# Patient Record
Sex: Female | Born: 1990 | Race: White | Hispanic: No | Marital: Married | Smoking: Never smoker
Health system: Southern US, Community
[De-identification: ages and names within clinical notes are randomized; demographics above are authoritative.]

## PROBLEM LIST (undated history)

## (undated) DIAGNOSIS — F419 Anxiety disorder, unspecified: Secondary | ICD-10-CM

## (undated) DIAGNOSIS — N8501 Benign endometrial hyperplasia: Secondary | ICD-10-CM

## (undated) DIAGNOSIS — N83209 Unspecified ovarian cyst, unspecified side: Secondary | ICD-10-CM

## (undated) DIAGNOSIS — N920 Excessive and frequent menstruation with regular cycle: Secondary | ICD-10-CM

## (undated) DIAGNOSIS — N8502 Endometrial intraepithelial neoplasia [EIN]: Secondary | ICD-10-CM

## (undated) DIAGNOSIS — R519 Headache, unspecified: Secondary | ICD-10-CM

## (undated) DIAGNOSIS — N979 Female infertility, unspecified: Secondary | ICD-10-CM

## (undated) DIAGNOSIS — R112 Nausea with vomiting, unspecified: Secondary | ICD-10-CM

## (undated) DIAGNOSIS — N970 Female infertility associated with anovulation: Secondary | ICD-10-CM

## (undated) DIAGNOSIS — N921 Excessive and frequent menstruation with irregular cycle: Secondary | ICD-10-CM

## (undated) DIAGNOSIS — E282 Polycystic ovarian syndrome: Secondary | ICD-10-CM

## (undated) DIAGNOSIS — Z9889 Other specified postprocedural states: Secondary | ICD-10-CM

## (undated) HISTORY — DX: Polycystic ovarian syndrome: E28.2

## (undated) HISTORY — DX: Female infertility, unspecified: N97.9

## (undated) HISTORY — DX: Benign endometrial hyperplasia: N85.01

## (undated) HISTORY — PX: APPENDECTOMY: SHX54

## (undated) HISTORY — DX: Excessive and frequent menstruation with regular cycle: N92.0

## (undated) HISTORY — DX: Unspecified ovarian cyst, unspecified side: N83.209

## (undated) HISTORY — PX: DILATION AND CURETTAGE OF UTERUS: SHX78

---
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
## 2011-05-11 ENCOUNTER — Emergency Department: Payer: Self-pay | Admitting: Emergency Medicine

## 2012-01-11 ENCOUNTER — Emergency Department: Payer: Self-pay | Admitting: Emergency Medicine

## 2012-01-11 LAB — CBC WITH DIFFERENTIAL/PLATELET
Basophil %: 0.3 %
Eosinophil #: 0.1 10*3/uL (ref 0.0–0.7)
Eosinophil %: 0.3 %
HCT: 38.1 % (ref 35.0–47.0)
HGB: 12.7 g/dL (ref 12.0–16.0)
Lymphocyte #: 1.7 10*3/uL (ref 1.0–3.6)
Lymphocyte %: 11.1 %
Monocyte #: 1.2 x10 3/mm — ABNORMAL HIGH (ref 0.2–0.9)
Monocyte %: 7.6 %
Neutrophil #: 12.4 10*3/uL — ABNORMAL HIGH (ref 1.4–6.5)
Neutrophil %: 80.7 %
WBC: 15.4 10*3/uL — ABNORMAL HIGH (ref 3.6–11.0)

## 2012-01-11 LAB — BASIC METABOLIC PANEL
Anion Gap: 8 (ref 7–16)
BUN: 12 mg/dL (ref 7–18)
Creatinine: 0.75 mg/dL (ref 0.60–1.30)
EGFR (African American): >60
EGFR (Non-African Amer.): >60
Glucose: 95 mg/dL (ref 65–99)
Osmolality: 283 (ref 275–301)

## 2012-01-11 LAB — HCG, QUANTITATIVE, PREGNANCY: Beta Hcg, Quant.: <1 m[IU]/mL — ABNORMAL LOW

## 2012-01-11 LAB — PROTIME-INR: INR: 0.9

## 2012-01-11 LAB — APTT: Activated PTT: 27.5 secs (ref 23.6–35.9)

## 2013-08-20 ENCOUNTER — Ambulatory Visit: Payer: Self-pay | Admitting: Obstetrics and Gynecology

## 2013-08-20 LAB — COMPREHENSIVE METABOLIC PANEL
ALK PHOS: 95 U/L
Albumin: 4 g/dL (ref 3.4–5.0)
Anion Gap: 4 — ABNORMAL LOW (ref 7–16)
BUN: 13 mg/dL (ref 7–18)
Bilirubin,Total: 0.5 mg/dL (ref 0.2–1.0)
CALCIUM: 9 mg/dL (ref 8.5–10.1)
Chloride: 106 mmol/L (ref 98–107)
Co2: 28 mmol/L (ref 21–32)
Creatinine: 0.66 mg/dL (ref 0.60–1.30)
EGFR (African American): >60
EGFR (Non-African Amer.): >60
Glucose: 84 mg/dL (ref 65–99)
Osmolality: 275 (ref 275–301)
POTASSIUM: 4 mmol/L (ref 3.5–5.1)
SGOT(AST): 31 U/L (ref 15–37)
SGPT (ALT): 43 U/L (ref 12–78)
SODIUM: 138 mmol/L (ref 136–145)
TOTAL PROTEIN: 7.9 g/dL (ref 6.4–8.2)

## 2013-08-20 LAB — CBC
HCT: 31.5 % — ABNORMAL LOW (ref 35.0–47.0)
HGB: 10.3 g/dL — AB (ref 12.0–16.0)
MCH: 25.5 pg — ABNORMAL LOW (ref 26.0–34.0)
MCHC: 32.8 g/dL (ref 32.0–36.0)
MCV: 78 fL — AB (ref 80–100)
Platelet: 358 10*3/uL (ref 150–440)
RBC: 4.05 10*6/uL (ref 3.80–5.20)
RDW: 15.5 % — AB (ref 11.5–14.5)
WBC: 10.5 10*3/uL (ref 3.6–11.0)

## 2013-08-28 ENCOUNTER — Ambulatory Visit: Payer: Self-pay | Admitting: Obstetrics and Gynecology

## 2013-08-30 LAB — PATHOLOGY REPORT

## 2014-09-12 NOTE — Op Note (Signed)
PATIENT NAME:  Pamela Warren, [name] MR#:  [id number] DATE OF BIRTH:  1991-01-12  DATE OF PROCEDURE:  08/28/2013  PREOPERATIVE DIAGNOSES: 1.  Abnormal uterine bleeding.  2.  Endometrial polyp.   POSTOPERATIVE DIAGNOSES:  1.  Abnormal uterine bleeding.  2.  Endometrial polyp.   PROCEDURES: 1.  Dilation and curettage.  2.  Hysteroscopy.  3.  Polypectomy.   SURGEON: Thomasene MohairStephen Azelia Reiger, M.D.   ANESTHESIA: General.   ESTIMATED BLOOD LOSS: 50 mL.  OPERATIVE FLUIDS: 800 mL crystalloid.   COMPLICATIONS: None.   FINDINGS: 1.  Normal-appearing uterine cavity anatomy.  2.  Shaggy-appearing endometrium, possible polyps.   SPECIMENS: Endometrial curettings and polyps.   CONDITION AT THE END OF PROCEDURE: Stable.   PROCEDURE IN DETAIL: The patient was taken to the operating room where general anesthesia was administered and found to be adequate. She was placed in the dorsal supine high lithotomy position in candy cane stirrups and prepped and draped in the usual sterile fashion. After a timeout was called, a sterile speculum was placed in the vagina and a single-tooth tenaculum was used to grasp the anterior lip of the cervix. The cervix was then gently dilated using Hegar dilators to a dilatation of 6 mm. The MyoSure hysteroscope was gently passed through the cervix with the above-noted findings. The MyoSure device was then connected and global curettings were taken as the endometrium had a global effect of shaggy endometrium and it was difficult to differentiate shaggy endometrium from any potential polyps, although 1 or 2 areas after being shaved by the MyoSure device did have a fair amount of bleeding, which resolved with tamponade. After the uterine cavity was cleaned, general curettage was performed, and then the camera was reintroduced after the curettage to ensure hemostasis, which was guarantee after lowering the pressure of the fluid management system below that of the patient's mean arterial  pressure.   The patient tolerated the procedure well. All instrumentation was removed from the cervix and vagina with hemostasis noted. She had a fluid deficit of approximately 200 mL. She was given doxycycline 100 mg IV on call to the OR due to her hydrosalpinx and risk for infection in her pelvis. She was wearing pneumatic compression stockings throughout the entire procedure for  VTE prophylaxis. She was awakened in the operating room and taken to the recovery area in stable condition.   ____________________________ Conard NovakStephen D. Aryam Zhan, MD sdj:sb D: 08/28/2013 10:10:01 ET T: 08/28/2013 10:39:04 ET JOB#: [id number]  cc: Conard NovakStephen D. Persephone Schriever, MD, <Dictator> Conard NovakSTEPHEN D Deondrick Searls MD ELECTRONICALLY SIGNED 09/09/2013 21:35

## 2015-07-21 ENCOUNTER — Encounter: Payer: Self-pay | Admitting: Obstetrics and Gynecology

## 2015-08-11 ENCOUNTER — Encounter: Payer: Self-pay | Admitting: Obstetrics and Gynecology

## 2015-08-25 ENCOUNTER — Ambulatory Visit (INDEPENDENT_AMBULATORY_CARE_PROVIDER_SITE_OTHER): Payer: Managed Care, Other (non HMO) | Admitting: Obstetrics and Gynecology

## 2015-08-25 ENCOUNTER — Encounter: Payer: Self-pay | Admitting: Obstetrics and Gynecology

## 2015-08-25 VITALS — BP 110/73 | HR 74 | Ht 63.0 in | Wt 238.3 lb

## 2015-08-25 DIAGNOSIS — R638 Other symptoms and signs concerning food and fluid intake: Secondary | ICD-10-CM | POA: Diagnosis not present

## 2015-08-25 DIAGNOSIS — Z6841 Body Mass Index (BMI) 40.0 and over, adult: Secondary | ICD-10-CM | POA: Diagnosis not present

## 2015-08-25 DIAGNOSIS — N979 Female infertility, unspecified: Secondary | ICD-10-CM | POA: Diagnosis not present

## 2015-08-25 DIAGNOSIS — N926 Irregular menstruation, unspecified: Secondary | ICD-10-CM

## 2015-08-25 NOTE — Patient Instructions (Signed)
1. Return on cycle day #21 for blood work 2. Return in 3 months for annual exam 3. Maintain menstrual calendar monitoring for assessment of cycles   Infertility Infertility is when you are unable to get pregnant (conceive) after a year of having sex regularly without using birth control. Infertility can also mean that a woman is not able to carry a pregnancy to full term.  Both women and men can have fertility problems. WHAT CAUSES INFERTILITY? What Causes Infertility in Women? There are many possible causes of infertility in women. For some women, the cause of infertility is not known (unexplained infertility). Infertility can also be linked to more than one cause. Infertility problems in women can be caused by problems with the menstrual cycle or reproductive organs, certain medical conditions, and factors related to lifestyle and age.  Problems with your menstrual cycle can interfere with your ovaries producing eggs (ovulation). This can make it difficult to get pregnant. This includes having a menstrual cycle that is very long, very short, or irregular.  Problems with reproductive organs can include:  An abnormally narrow cervix or a cervix that does not remain closed during a pregnancy.  A blockage in your fallopian tubes.  An abnormally shaped uterus.  Uterine fibroids. This is a tissue mass (tumor) that can develop on your uterus.  Medical conditions that can affect a woman's fertility include:  Polycystic ovarian syndrome (PCOS). This is a hormonal disorder that can cause small cysts to grow on your ovaries. This is the most common cause of infertility in women.  Endometriosis. This is a condition in which the tissue that lines your uterus (endometrium) grows outside of its normal location.  Primary ovary insufficiency. This is when your ovaries stop producing eggs and hormones before the age of 24.  Sexually transmitted diseases, such as chlamydia or gonorrhea. These  infections can cause scarring in your fallopian tubes. This makes it difficult for eggs to reach your uterus.  Autoimmune disorders. These are disorders in which your immune system attacks normal, healthy cells.  Hormone imbalances.  Other factors include:  Age. A woman's fertility declines with age, especially after her mid-30s.  Being under- or overweight.  Drinking too much alcohol.  Using drugs.  Exercising excessively.  Being exposed to environmental toxins, such as radiation, pesticides, and certain chemicals. What Causes Infertility in Men? There are many causes of infertility in men. Infertility can be linked to more than one cause. Infertility problems in men can be caused by problems with sperm or the reproductive organs, certain medical conditions, and factors related to lifestyle and age. Some men have unexplained infertility.   Problems with sperm. Infertility can result if there is a problem producing:  Enough sperm (low sperm count).  Enough normally-shaped sperm (sperm morphology).  Sperm that are able to reach the egg (poor motility).  Infertility can also be caused by:  A problem with hormones.  Enlarged veins (varicoceles), cysts (spermatoceles), or tumors of the testicles.  Sexual dysfunction.  Injury to the testicles.  A birth defect, such as not having the tubes that carry sperm (vas deferens).  Medical conditions that can affect a man's fertility include:  Diabetes.  Cancer treatments, such as chemotherapy or radiation.  Klinefelter syndrome. This is an inherited genetic disorder.   Thyroid problems, such as an under- or overactive thyroid.  Cystic fibrosis.  Sexually transmitted diseases.  Other factors include:  Age. A man's fertility declines with age.  Drinking too much alcohol.  Using  drugs.  Being exposed to environmental toxins, such as pesticides and lead. WHAT ARE THE SYMPTOMS OF INFERTILITY? Being unable to get  pregnant after one year of having regular sex without using birth control is the only sign of infertility.  HOW IS INFERTILITY DIAGNOSED? In order to be diagnosed with infertility, both partners will have a physical exam. Both partners will also have an extensive medical and sexual history taken. If there is no obvious reason for infertility, additional tests may be done. What Tests Will Women Have? Women may first have tests to check whether they are ovulating each month. The tests may include:  Blood tests to check hormone levels.  An ultrasound of the ovaries. This looks for possible problems on or in the ovaries.  Taking a small sample of the tissue that lines the uterus for examination under a microscope (endometrial biopsy). Women who are ovulating may have additional tests. These may include:  Hysterosalpingography.  This is an X-ray of the fallopian tubes and uterus taken after a specific type of dye is injected.  This test can show the shape of the uterus and whether the fallopian tubes are open.  Laparoscopy.  In this test, a lighted tube (laparoscope) is used to look for problems in the fallopian tubes and other female organs.  Transvaginal ultrasound.  This is an imaging test to check for abnormalities of the uterus and ovaries.  A health care provider can use this test to count the number of follicles on the ovaries.  Hysteroscopy.  This test involves using a lighted tube to examine the cervix and inside the uterus.  It is done to find any abnormalities inside the uterus. What Tests Will Men Have? Tests for men's infertility includes:  Semen tests to check sperm count, morphology, and motility.  Blood tests to check for hormone levels.  Taking a small sample of tissue from inside a testicle (biopsy). This is examined under a microscope.  Blood tests to check for genetic abnormalities (genetic testing). HOW ARE WOMEN TREATED FOR INFERTILITY?  Treatment depends  on the cause of infertility. Most cases of infertility in women are treated with medicine or surgery.  Women may take medicine to:  Correct ovulation problems.  Treat other health conditions, such as PCOS.  Surgery may be done to:  Repair damage to the ovaries, fallopian tubes, cervix, or uterus.  Remove growths from the uterus.  Remove scar tissue from the uterus, pelvis, or other female organs. HOW ARE MEN TREATED FOR INFERTILITY?  Treatment depends on the cause of infertility. Most cases of infertility in men are treated with medicine or surgery.   Men may take medicine to:  Correct hormone problems.  Treat other health conditions.  Treat sexual dysfunction.  Surgery may be done to:  Remove blockages in the reproductive tract.  Correct other structural problems of the reproductive tract. WHAT IS ASSISTED REPRODUCTIVE TECHNOLOGY? Assisted reproductive technology (ART) refers to all treatments and procedures that combine eggs and sperm outside the body to try to help a couple conceive. ART is often combined with fertility drugs to stimulate ovulation. Sometimes ART is done using eggs retrieved from another woman's body (donor eggs) or from previously frozen fertilized eggs (embryos).  There are different types of ART. These include:   Intrauterine insemination (IUI).  In this procedure, sperm is placed directly into a woman's uterus with a long, thin tube.  This may be most effective for infertility caused by sperm problems, including low sperm count and  low motility.  Can be used in combination with fertility drugs.  In vitro fertilization (IVF).  This is often done when a woman's fallopian tubes are blocked or when a man has low sperm counts.  Fertility drugs stimulate the ovaries to produce multiple eggs. Once mature, these eggs are removed from the body and combined with the sperm to be fertilized.  These fertilized eggs are then placed in the woman's uterus.    This information is not intended to replace advice given to you by your health care provider. Make sure you discuss any questions you have with your health care provider.   Document Released: 05/11/2003 Document Revised: 01/27/2015 Document Reviewed: 01/21/2014 Elsevier Interactive Patient Education [employer]

## 2015-08-25 NOTE — Progress Notes (Signed)
GYN ENCOUNTER NOTE  Subjective:       Pamela Warren is a 25 y.o. G0P0000 female is here for gynecologic evaluation of the following issues:  1.Irregular menstrual cycles   Gynecologic History Patient's last menstrual period was 08/02/2015 (approximate). Contraception: none Last Pap: Normal Last mammogram: NA  Menarche: Age 25 Intervals: Monthly, regular until 2-1/2 years ago; now every 4-7 weeks Duration: 5-7 days, mild to moderate bleeding Dysmenorrhea: Mild, not requiring medication; never missed school or work Dyspareunia: Negative Pap smear history: No abnormals PID STI history: None  Obstetric History OB History  Gravida Para Term Preterm AB SAB TAB Ectopic Multiple Living  0 0 0 0 0 0 0 0 0 0         Past Medical History  Diagnosis Date  . Heavy menstrual bleeding     Past Surgical History  Procedure Laterality Date  . Dilation and curettage of uterus      2015- cyst in tube -Dr Binnie Railjackson [place]    No current outpatient prescriptions on file prior to visit.   No current facility-administered medications on file prior to visit.    No Known Allergies  Social History   Social History  . Marital Status: Married    Spouse Name: N/A  . Number of Children: N/A  . Years of Education: N/A   Occupational History  . Not on file.   Social History Main Topics  . Smoking status: Never Smoker   . Smokeless tobacco: Not on file  . Alcohol Use: No  . Drug Use: No  . Sexual Activity: Yes    Birth Control/ Protection: None   Other Topics Concern  . Not on file   Social History Narrative  . No narrative on file    Family History  Problem Relation Age of Onset  . Diabetes Father   . Ovarian cancer Maternal Grandmother   . Breast cancer Maternal Grandmother   . Diabetes Maternal Grandmother   . Breast cancer Paternal Grandmother   . Diabetes Paternal Grandmother   . Colon cancer Paternal Grandfather   . Heart disease Neg Hx     The following portions  of the patient's history were reviewed and updated as appropriate: allergies, current medications, past family history, past medical history, past social history, past surgical history and problem list.  Review of Systems Review of Systems - General ROS: 28 pound weight loss over 2 years through healthy eating and exercise with personal trainer Hematological and Lymphatic ROS: negative Endocrine ROS: negative for - breast changes, galactorrhea, hot flashes or malaise/lethargy Breast ROS: negative for breast lumps negative for - galactorrhea Respiratory ROS: no cough, shortness of breath, or wheezing Cardiovascular ROS: no chest pain or dyspnea on exertion Gastrointestinal ROS: no abdominal pain, change in bowel habits, or black or bloody stools Genito-Urinary ROS: negative for - dyspareunia, dysuria, genital discharge, genital ulcers or pelvic pain Musculoskeletal ROS: negative   Objective:   BP 110/73 mmHg  Pulse 74  Ht 5\' 3"  (1.6 m)  Wt 238 lb 4.8 oz (108.092 kg)  BMI 42.22 kg/m2  LMP 08/02/2015 (Approximate) Physical exam-deferred  Assessment:   1. Irregular periods/menstrual cycles-every 4 -7 weeks  2. Increased BMI  3. Infertility      Plan:   1. Menstrual calendar monitoring 2. Continue with healthy eating, exercise, and ongoing slow steady weight loss 3. Screening blood work including lipid 1 panel, TSH, fasting blood sugar, hemoglobin A1c, vitamin D level, and day 22 serum progesterone level  4. Timed coitus 5. Discussed possible etiology to infertility including possible chronic anovulation due to a functional PCO status. 6. Lab studies will be shared with patient by telephone along with further management planning. If she appears to be anovulatory, we will then consider Clomid therapy. If she is ovulatory, we will have her follow-up in 3 months for further management planning and annual physical exam  A total of 45 minutes were spent face-to-face with the patient  during the encounter with greater than 50% dealing with counseling and coordination of care.  Herold Harms, MD  Note: This dictation was prepared with Dragon dictation along with smaller phrase technology. Any transcriptional errors that result from this process are unintentional.

## 2015-10-11 ENCOUNTER — Other Ambulatory Visit: Payer: Managed Care, Other (non HMO)

## 2015-10-11 ENCOUNTER — Other Ambulatory Visit: Payer: Self-pay | Admitting: Obstetrics and Gynecology

## 2015-10-11 DIAGNOSIS — N979 Female infertility, unspecified: Secondary | ICD-10-CM

## 2015-10-11 NOTE — Addendum Note (Signed)
Addended by: Marchelle FolksMILLER, Rachal Dvorsky G on: 10/11/2015 10:57 AM   Modules accepted: Orders

## 2015-10-12 ENCOUNTER — Telehealth: Payer: Self-pay

## 2015-10-12 DIAGNOSIS — N979 Female infertility, unspecified: Secondary | ICD-10-CM

## 2015-10-12 LAB — PROGESTERONE: PROGESTERONE: 0.1 ng/mL

## 2015-10-12 MED ORDER — CLOMIPHENE CITRATE 50 MG PO TABS: 50.0000 mg | ORAL_TABLET | Freq: Every day | ORAL | Status: DC

## 2015-10-12 NOTE — Telephone Encounter (Signed)
-----   Message from Herold HarmsMartin A Defrancesco, MD sent at 10/12/2015 10:22 AM EDT ----- Please notify - Abnormal Labs Serum progesterone level is consistent with no ovulation. If and when patient desires, Clomid therapy can be initiated to help with ovulation induction

## 2015-10-12 NOTE — Telephone Encounter (Signed)
Pt aware. Wanted clomid. Erx. Advised clomid days 5-9. IC days 13-16 and progesterone on day 22.

## 2015-10-28 ENCOUNTER — Ambulatory Visit: Payer: Managed Care, Other (non HMO) | Admitting: Obstetrics and Gynecology

## 2015-11-03 ENCOUNTER — Ambulatory Visit: Payer: Managed Care, Other (non HMO) | Admitting: Obstetrics and Gynecology

## 2015-11-11 ENCOUNTER — Ambulatory Visit: Payer: Managed Care, Other (non HMO) | Admitting: Obstetrics and Gynecology

## 2015-11-18 ENCOUNTER — Encounter: Payer: Self-pay | Admitting: Obstetrics and Gynecology

## 2015-11-18 ENCOUNTER — Ambulatory Visit (INDEPENDENT_AMBULATORY_CARE_PROVIDER_SITE_OTHER): Payer: Managed Care, Other (non HMO) | Admitting: Obstetrics and Gynecology

## 2015-11-18 VITALS — BP 104/69 | HR 99 | Ht 62.5 in | Wt 244.8 lb

## 2015-11-18 DIAGNOSIS — N926 Irregular menstruation, unspecified: Secondary | ICD-10-CM

## 2015-11-18 DIAGNOSIS — R638 Other symptoms and signs concerning food and fluid intake: Secondary | ICD-10-CM

## 2015-11-18 DIAGNOSIS — N979 Female infertility, unspecified: Secondary | ICD-10-CM

## 2015-11-18 NOTE — Progress Notes (Signed)
Chief complaint: 1. Infertility  Patient presents for follow-up on first cycle of Clomid therapy for infertility likely thought to be due to anovulation. Patient and 50 mg Clomid. Cycle-25 days Manifest date [facility]  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 Status post first cycle of Clomid therapy with 25 day cycle 3. Did not obtain serum progesterone testing  PLAN: 1. Continue with Clomid 50 mg daily days 5 through 9 for this cycle 2. Day 22 serum progesterone test 3. Return in 1 month for follow-up  A total of 15 minutes were spent face-to-face with the patient during this encounter and over half of that time dealt with counseling and coordination of care.  Herold HarmsMartin A Ingra Rother, MD  Note: This dictation was prepared with Dragon dictation along with smaller phrase technology. Any transcriptional errors that result from this process are unintentional.

## 2015-11-18 NOTE — Patient Instructions (Signed)
1. Continue with Clomid 50 mg a day days 5 through 9 2. Obtain serum progesterone level on 12/10/2015 3. Return for follow-up on 12/16/2015

## 2015-12-10 ENCOUNTER — Other Ambulatory Visit: Payer: Managed Care, Other (non HMO)

## 2015-12-11 LAB — PROGESTERONE: Progesterone: 0.1 ng/mL

## 2015-12-16 ENCOUNTER — Encounter: Payer: Self-pay | Admitting: Obstetrics and Gynecology

## 2015-12-16 ENCOUNTER — Ambulatory Visit (INDEPENDENT_AMBULATORY_CARE_PROVIDER_SITE_OTHER): Payer: Managed Care, Other (non HMO) | Admitting: Obstetrics and Gynecology

## 2015-12-16 VITALS — BP 114/70 | HR 89 | Ht 62.0 in | Wt 245.6 lb

## 2015-12-16 DIAGNOSIS — N970 Female infertility associated with anovulation: Secondary | ICD-10-CM

## 2015-12-16 DIAGNOSIS — N926 Irregular menstruation, unspecified: Secondary | ICD-10-CM | POA: Diagnosis not present

## 2015-12-16 DIAGNOSIS — Z6841 Body Mass Index (BMI) 40.0 and over, adult: Secondary | ICD-10-CM | POA: Diagnosis not present

## 2015-12-16 MED ORDER — CLOMIPHENE CITRATE 50 MG PO TABS: 50.0000 mg | ORAL_TABLET | Freq: Every day | ORAL | 1 refills | Status: DC

## 2015-12-16 MED ORDER — CLOMIPHENE CITRATE 50 MG PO TABS: ORAL_TABLET | ORAL | 1 refills | Status: DC

## 2015-12-16 NOTE — Progress Notes (Signed)
Chief complaint: 1. Anovulatory infertility 2. Morbid obesity  Current cycle-23 days Day 22 serum progesterone-0.1 (anovulatory) on 50 mg of Clomid Currently on day 6 of cycle  Plan: 1. Increase Clomid to 100 mg a day days 6 through 10 2. Obtained today 22 serum progesterone on 12/31/2015 3. Continue with timed intercourse 4. Return on 01/11/2016 for follow-up 5. May add metformin with next Cycle if necessary  A total of 15 minutes were spent face-to-face with the patient during this encounter and over half of that time dealt with counseling and coordination of care.  Herold Harms, MD  Note: This dictation was prepared with Dragon dictation along with smaller phrase technology. Any transcriptional errors that result from this process are unintentional.

## 2015-12-16 NOTE — Patient Instructions (Signed)
1. Take Clomid 100 mg a day days 6 through 10 2. Obtained today 22 serum progesterone on August 11 3. Continue with timed intercourse 4. Return in August 22 for follow-up

## 2015-12-31 ENCOUNTER — Other Ambulatory Visit: Payer: Self-pay | Admitting: Obstetrics and Gynecology

## 2015-12-31 ENCOUNTER — Other Ambulatory Visit: Payer: Managed Care, Other (non HMO)

## 2016-01-01 LAB — PROGESTERONE: PROGESTERONE: 13.5 ng/mL

## 2016-01-04 ENCOUNTER — Encounter: Payer: Self-pay | Admitting: Obstetrics and Gynecology

## 2016-01-11 ENCOUNTER — Ambulatory Visit (INDEPENDENT_AMBULATORY_CARE_PROVIDER_SITE_OTHER): Payer: Managed Care, Other (non HMO) | Admitting: Obstetrics and Gynecology

## 2016-01-11 VITALS — BP 119/72 | HR 89 | Ht 62.0 in | Wt 248.6 lb

## 2016-01-11 DIAGNOSIS — N926 Irregular menstruation, unspecified: Secondary | ICD-10-CM | POA: Diagnosis not present

## 2016-01-11 DIAGNOSIS — N970 Female infertility associated with anovulation: Secondary | ICD-10-CM

## 2016-01-11 MED ORDER — CLOMIPHENE CITRATE 50 MG PO TABS: ORAL_TABLET | ORAL | 5 refills | Status: DC

## 2016-01-11 NOTE — Progress Notes (Signed)
Chief complaint: 1. Anovulatory infertility-conference  Patient presents for a conference regarding Clomid management.  Current cycle 29 days Serum progesterone-13.5 consistent with ovulation Current dose of Clomid-100 mg  Patient understands that she had ovulatory cycle with 100 mg of Clomid. She will continue this therapy over the next 5 cycles. Metformin will not be started at this time She will continue with prenatal vitamins. She will return in 4 months for follow-up.  A total of 15 minutes were spent face-to-face with the patient during this encounter and over half of that time dealt with counseling and coordination of care.  Herold HarmsMartin A Gurtej Noyola, MD  Note: This dictation was prepared with Dragon dictation along with smaller phrase technology. Any transcriptional errors that result from this process are unintentional.

## 2016-01-11 NOTE — Patient Instructions (Signed)
1. Continue with Clomid 100 mg a day days 5 through 9 2. Continue with timed intercourse on days 13, 15, 17 3. Return in 4 months for follow-up

## 2016-02-08 ENCOUNTER — Encounter: Payer: Self-pay | Admitting: Obstetrics and Gynecology

## 2016-02-14 ENCOUNTER — Encounter: Payer: Self-pay | Admitting: Obstetrics and Gynecology

## 2016-02-21 ENCOUNTER — Telehealth: Payer: Self-pay | Admitting: Obstetrics and Gynecology

## 2016-02-21 ENCOUNTER — Other Ambulatory Visit: Payer: Managed Care, Other (non HMO)

## 2016-02-21 ENCOUNTER — Encounter: Payer: Self-pay | Admitting: Obstetrics and Gynecology

## 2016-02-21 ENCOUNTER — Other Ambulatory Visit: Payer: Self-pay

## 2016-02-21 DIAGNOSIS — N912 Amenorrhea, unspecified: Secondary | ICD-10-CM

## 2016-02-21 NOTE — Telephone Encounter (Signed)
Pt still has not started her period and she wanted to see if she could come have a blood Beta HCG test. Please call her

## 2016-02-21 NOTE — Telephone Encounter (Signed)
Pt aware per my chart message she may have beta drawn. Lab ordered and pt put on lab schedule.

## 2016-02-22 ENCOUNTER — Encounter: Payer: Self-pay | Admitting: Obstetrics and Gynecology

## 2016-02-22 ENCOUNTER — Other Ambulatory Visit: Payer: Self-pay

## 2016-02-22 LAB — BETA HCG QUANT (REF LAB)

## 2016-02-22 MED ORDER — MEDROXYPROGESTERONE ACETATE 10 MG PO TABS: 10.0000 mg | ORAL_TABLET | Freq: Every day | ORAL | 0 refills | Status: DC

## 2016-02-28 ENCOUNTER — Encounter: Payer: Self-pay | Admitting: Obstetrics and Gynecology

## 2016-03-08 ENCOUNTER — Encounter: Payer: Self-pay | Admitting: Obstetrics and Gynecology

## 2016-03-08 ENCOUNTER — Other Ambulatory Visit: Payer: Self-pay

## 2016-03-08 MED ORDER — MEDROXYPROGESTERONE ACETATE 10 MG PO TABS: 10.0000 mg | ORAL_TABLET | Freq: Every day | ORAL | 0 refills | Status: DC

## 2016-03-08 MED ORDER — CLOMIPHENE CITRATE 50 MG PO TABS: ORAL_TABLET | ORAL | 1 refills | Status: DC

## 2016-06-29 ENCOUNTER — Encounter: Payer: Self-pay | Admitting: Obstetrics and Gynecology

## 2017-09-11 ENCOUNTER — Encounter: Payer: Self-pay | Admitting: Obstetrics and Gynecology

## 2017-09-11 ENCOUNTER — Ambulatory Visit: Payer: BC Managed Care – PPO | Admitting: Obstetrics and Gynecology

## 2017-09-11 ENCOUNTER — Telehealth: Payer: Self-pay | Admitting: Obstetrics and Gynecology

## 2017-09-11 VITALS — BP 102/70 | HR 87 | Ht 62.0 in | Wt 237.6 lb

## 2017-09-11 DIAGNOSIS — Z6841 Body Mass Index (BMI) 40.0 and over, adult: Secondary | ICD-10-CM | POA: Diagnosis not present

## 2017-09-11 DIAGNOSIS — N926 Irregular menstruation, unspecified: Secondary | ICD-10-CM | POA: Diagnosis not present

## 2017-09-11 DIAGNOSIS — N921 Excessive and frequent menstruation with irregular cycle: Secondary | ICD-10-CM | POA: Diagnosis not present

## 2017-09-11 DIAGNOSIS — N83209 Unspecified ovarian cyst, unspecified side: Secondary | ICD-10-CM

## 2017-09-11 MED ORDER — CLOMIPHENE CITRATE 50 MG PO TABS: 100.0000 mg | ORAL_TABLET | Freq: Every day | ORAL | 1 refills | Status: DC

## 2017-09-11 MED ORDER — MEDROXYPROGESTERONE ACETATE 10 MG PO TABS: ORAL_TABLET | ORAL | 0 refills | Status: DC

## 2017-09-11 NOTE — Telephone Encounter (Signed)
Spoke with phramacist earlier to day. Per mad pt needs 30mg  provera qd x 30.

## 2017-09-11 NOTE — Patient Instructions (Signed)
1.  Endometrial biopsy is performed today 2.  Pelvic ultrasound is scheduled 3.  Begin Provera 30 mg a day times 30 days. 4.  Start Clomid following completion of Provera therapy, taking 100 mg a day on days 5 through 9 5.  Return for day 22 serum progesterone level blood test 6.  Follow-up in 10 weeks 7.  Be sure to continue taking prenatal vitamins daily.   Endometrial Biopsy, Care After This sheet gives you information about how to care for yourself after your procedure. Your health care provider may also give you more specific instructions. If you have problems or questions, contact your health care provider. What can I expect after the procedure? After the procedure, it is common to have:  Mild cramping.  A small amount of vaginal bleeding for a few days. This is normal.  Follow these instructions at home:  Take over-the-counter and prescription medicines only as told by your health care provider.  Do not douche, use tampons, or have sexual intercourse until your health care provider approves.  Return to your normal activities as told by your health care provider. Ask your health care provider what activities are safe for you.  Follow instructions from your health care provider about any activity restrictions, such as restrictions on strenuous exercise or heavy lifting. Contact a health care provider if:  You have heavy bleeding, or bleed for longer than 2 days after the procedure.  You have bad smelling discharge from your vagina.  You have a fever or chills.  You have a burning sensation when urinating or you have difficulty urinating.  You have severe pain in your lower abdomen. Get help right away if:  You have severe cramps in your stomach or back.  You pass large blood clots.  Your bleeding increases.  You become weak or light-headed, or you pass out. Summary  After the procedure, it is common to have mild cramping and a small amount of vaginal bleeding for  a few days.  Do not douche, use tampons, or have sexual intercourse until your health care provider approves.  Return to your normal activities as told by your health care provider. Ask your health care provider what activities are safe for you. This information is not intended to replace advice given to you by your health care provider. Make sure you discuss any questions you have with your health care provider. Document Released: 02/26/2013 Document Revised: 05/24/2016 Document Reviewed: 05/24/2016 Elsevier Interactive Patient Education  2017 [employer]

## 2017-09-11 NOTE — Telephone Encounter (Signed)
The patients pharmacy called and stated that they were concerned about the dosage of the patients medication that was just sent in. medroxyPROGESTERone (PROVERA) 10 MG tablet. Please advise.

## 2017-09-11 NOTE — Progress Notes (Signed)
GYN ENCOUNTER NOTE  Subjective:       Pamela Warren is a 27 y.o. G0P0000 female is here for gynecologic evaluation of the following issues:  1.  Abnormal uterine bleeding 2.  Secondary infertility  27 year old female, history of PCO, history of obesity 3, and history of anovulatory cycles, presents for ongoing evaluation of abnormal uterine bleeding and desire for fertility.  1 year ago patient had been placed on Clomid 100 mg daily on days 5 through 9 for induction of ovulation.  She took the medication for several months and then spontaneously discontinued it because of her husband's availability for attempt at conception. In October 2018 patient was reportedly diagnosed with a 10 cm simple ovarian cyst; no significant intervention was accomplished; patient was transiently symptomatic with nausea and vomiting, which spontaneously resolved.  Patient did not have any follow-up regarding that cystic mass.  Patient now desires to attempt conception.  She is wanting to return to Clomid therapy. Over the past few months she has had irregular menstrual cycles with 2 periods per month, and on occasion had bleeding upwards of 9 or 10 days with clots.  She denies pelvic pain.   Gynecologic History Patient's last menstrual period was 09/01/2017.  Obstetric History OB History  Gravida Para Term Preterm AB Living  0 0 0 0 0 0  SAB TAB Ectopic Multiple Live Births  0 0 0 0      Past Medical History:  Diagnosis Date  . Heavy menstrual bleeding   . Infertility, female   . Ovarian cyst   . PCOS (polycystic ovarian syndrome)     Past Surgical History:  Procedure Laterality Date  . DILATION AND CURETTAGE OF UTERUS     2015- cyst in tube -Dr Binnie Rail    Current Outpatient Medications on File Prior to Visit  Medication Sig Dispense Refill  . Multiple Vitamin (MULTIVITAMIN) capsule Take 1 capsule by mouth daily.     No current facility-administered medications on file prior to visit.      No Known Allergies  Social History   Socioeconomic History  . Marital status: Married    Spouse name: Not on file  . Number of children: Not on file  . Years of education: Not on file  . Highest education level: Not on file  Occupational History  . Not on file  Social Needs  . Financial resource strain: Not on file  . Food insecurity:    Worry: Not on file    Inability: Not on file  . Transportation needs:    Medical: Not on file    Non-medical: Not on file  Tobacco Use  . Smoking status: Never Smoker  . Smokeless tobacco: Never Used  Substance and Sexual Activity  . Alcohol use: Yes    Comment: rare  . Drug use: No  . Sexual activity: Yes    Birth control/protection: None  Lifestyle  . Physical activity:    Days per week: Not on file    Minutes per session: Not on file  . Stress: Not on file  Relationships  . Social connections:    Talks on phone: Not on file    Gets together: Not on file    Attends religious service: Not on file    Active member of club or organization: Not on file    Attends meetings of clubs or organizations: Not on file    Relationship status: Not on file  . Intimate partner violence:  Fear of current or ex partner: Not on file    Emotionally abused: Not on file    Physically abused: Not on file    Forced sexual activity: Not on file  Other Topics Concern  . Not on file  Social History Narrative  . Not on file    Family History  Problem Relation Age of Onset  . Diabetes Father   . Ovarian cancer Maternal Grandmother   . Breast cancer Maternal Grandmother   . Diabetes Maternal Grandmother   . Breast cancer Paternal Grandmother   . Diabetes Paternal Grandmother   . Colon cancer Paternal Grandfather   . Heart disease Neg Hx     The following portions of the patient's history were reviewed and updated as appropriate: allergies, current medications, past family history, past medical history, past social history, past surgical  history and problem list.  Review of Systems Comprehensive review of systems is negative except for that noted with regards to her irregular menstrual bleeding pattern  Objective:   BP 102/70   Pulse 87   Ht 5\' 2"  (1.575 m)   Wt 237 lb 9.6 oz (107.8 kg)   LMP 09/01/2017   BMI 43.46 kg/m  CONSTITUTIONAL: Well-developed, well-nourished female in no acute distress.  HENT:  Normocephalic, atraumatic.  NECK: Normal range of motion, supple, no masses.  Normal thyroid.  SKIN: Skin is warm and dry. No rash noted. Not diaphoretic. No erythema. No pallor. NEUROLGIC: Alert and oriented to person, place, and time. PSYCHIATRIC: Normal mood and affect. Normal behavior. Normal judgment and thought content. CARDIOVASCULAR:Not Examined RESPIRATORY: Not Examined BREASTS: Not Examined ABDOMEN: Soft, non distended; Non tender.  No Organomegaly. PELVIC:  External Genitalia: Normal  BUS: Normal  Vagina: Normal  Cervix: Normal; no lesions; no cervical motion tenderness  Uterus: Normal size, shape,consistency, mobile, midplane  Adnexa: Normal; nonpalpable, non tender  RV: Normal external exam  Bladder: Nontender MUSCULOSKELETAL: Normal range of motion. No tenderness.  No cyanosis, clubbing, or edema.  PROCEDURE: Endometrial Biopsy Procedure Note  Pre-operative Diagnosis: Menorrhagia; irregular menstrual cycles  Post-operative Diagnosis: same  Procedure Details   Urine pregnancy test was not done.  The risks (including infection, bleeding, pain, and uterine perforation) and benefits of the procedure were explained to the patient and Verbal informed consent was obtained.  Antibiotic prophylaxis against endocarditis was not indicated.   The patient was placed in the dorsal lithotomy position.  Bimanual exam showed the uterus to be in the neutral position.  A Graves' speculum inserted in the vagina.  Endocervical curettage with a Kevorkian curette was not performed.   A sharp tenaculum was not  applied to the anterior lip of the cervix for stabilization.  A sterile uterine sound was used to sound the uterus to a depth of 6.5cm.  A Mylex 3mm curette was used to sample the endometrium.  Sample was sent for pathologic examination.  Condition: Stable  Complications: None  Plan:  The patient was advised to call for any fever or for prolonged or severe pain or bleeding. She was advised to use OTC acetaminophen and OTC ibuprofen as needed for mild to moderate pain. She was advised to avoid vaginal intercourse for 48 hours or until the bleeding has completely stopped.  Attending Physician Documentation: Herold Harms, MD    Assessment:   1. Irregular periods/menstrual cycles - Pathology - Progesterone  2. Menorrhagia with irregular cycle - US PELVIS (TRANSABDOMINAL ONLY); Future - US PELVIS TRANSVANGINAL NON-OB (TV ONLY); Future - Pathology  3. Morbid obesity with BMI of 40.0-44.9, adult (HCC)  4. Cyst of ovary, unspecified laterality - US PELVIS (TRANSABDOMINAL ONLY); Future - US PELVIS TRANSVANGINAL NON-OB (TV ONLY); Future     Plan:   1.  Endometrial biopsy is performed today 2.  Pelvic ultrasound is scheduled 3.  Begin Provera 30 mg a day times 30 days. 4.  Start Clomid following completion of Provera therapy, taking 100 mg a day on days 5 through 9 5.  Return for day 22 serum progesterone level blood test 6.  Follow-up in 10 weeks 7.  Be sure to continue taking prenatal vitamins daily.  A total of 25 minutes were spent face-to-face with the patient during this encounter and over half of that time involved counseling and coordination of care.   Herold HarmsMartin A Marissa Lowrey, MD  Note: This dictation was prepared with Dragon dictation along with smaller phrase technology. Any transcriptional errors that result from this process are unintentional.

## 2017-09-13 ENCOUNTER — Ambulatory Visit (INDEPENDENT_AMBULATORY_CARE_PROVIDER_SITE_OTHER): Payer: BC Managed Care – PPO

## 2017-09-13 DIAGNOSIS — N921 Excessive and frequent menstruation with irregular cycle: Secondary | ICD-10-CM | POA: Diagnosis not present

## 2017-09-13 DIAGNOSIS — N83209 Unspecified ovarian cyst, unspecified side: Secondary | ICD-10-CM

## 2017-09-13 LAB — PATHOLOGY

## 2017-09-14 ENCOUNTER — Telehealth: Payer: Self-pay

## 2017-09-14 NOTE — Telephone Encounter (Signed)
LMTRC  Pt needs appt to discuss EMB results. Per [facility]- pt needs hysteroscopy and D&C.

## 2017-09-17 ENCOUNTER — Ambulatory Visit (INDEPENDENT_AMBULATORY_CARE_PROVIDER_SITE_OTHER): Payer: BC Managed Care – PPO | Admitting: Obstetrics and Gynecology

## 2017-09-17 ENCOUNTER — Encounter: Payer: Self-pay | Admitting: Obstetrics and Gynecology

## 2017-09-17 VITALS — BP 121/75 | HR 53 | Ht 62.0 in | Wt 244.9 lb

## 2017-09-17 DIAGNOSIS — Z6841 Body Mass Index (BMI) 40.0 and over, adult: Secondary | ICD-10-CM

## 2017-09-17 DIAGNOSIS — N838 Other noninflammatory disorders of ovary, fallopian tube and broad ligament: Secondary | ICD-10-CM | POA: Insufficient documentation

## 2017-09-17 DIAGNOSIS — N970 Female infertility associated with anovulation: Secondary | ICD-10-CM | POA: Diagnosis not present

## 2017-09-17 DIAGNOSIS — N8502 Endometrial intraepithelial neoplasia [EIN]: Secondary | ICD-10-CM

## 2017-09-17 DIAGNOSIS — N83209 Unspecified ovarian cyst, unspecified side: Secondary | ICD-10-CM | POA: Diagnosis not present

## 2017-09-17 DIAGNOSIS — N921 Excessive and frequent menstruation with irregular cycle: Secondary | ICD-10-CM | POA: Diagnosis not present

## 2017-09-17 NOTE — Progress Notes (Signed)
Chief complaint: 1.  Abnormal uterine bleeding 2.  Follow-up on endometrial biopsy and pelvic ultrasound  Pamela Warren presents for follow-up.  Endometrial biopsy: Diagnosis:  ENDOMETRIUM, BIOPSY:  COMPLEX HYPERPLASIA WITH ATYPIA.  COMMENT: This case has been reviewed in intradepartmental consultation and  the diagnosis reflects our consensus opinion.   Pelvic ultrasound: ULTRASOUND REPORT Location: [facility] Date of Service:  09/13/2017 Indications: Menorrhagia; Hx Ov Cyst Findings:  The uterus measures 7.1 x 6.1 x 4.2 cm. Echo texture is homogeneous without evidence of focal masses. The Endometrium measures 16.4 mm.  Right Ovary measures 8.0 x 8.7 x 7.1 cm and contains a large cyst measuring 8.7 x 6.8 x 6.2 cm.  Left Ovary measures 3.8 x 2.5 x 2.5 cm and contains a cyst/dominant follicle measuring 2.5 x 2.1 x 2.9 cm.  Survey of the adnexa demonstrates no adnexal masses, however, tubular cystic structures were identified in the right adnexa. ? Fallopian tube There is no free fluid in the cul de sac.  Impression: 1. Anteverted uterus appears of normal size and contour. 2. The endometrium measures 16.4 mm. 3. The right ovary is enlarged and contains a large simple cyst measuring 8.7 x 6.8 x 6.2 cm. 4. The left ovary contains a cyst/dominant follicle measuring 2.5 x 2.1 x 2.9 cm. 5. Tubular, cystic structures identified in the right adnexa. ? Fallopian tube  Recommendations: 1.Clinical correlation with the patient's History and Physical Exam. 2.  Roma score 3.  Recommend discussion of possible laparoscopy with ovarian cystectomy.   Kari Baars, RDMS Herold Harms, MD  SUBJECTIVE: Pamela Warren reports decreased bleeding while on the Provera.  She is wanting to get back on Clomid therapy for attempt at conception.  OBJECTIVE: BP 121/75   Pulse (!) 53   Ht  (1.575 m)   Wt 244 lb 14.4 oz (111.1 kg)   LMP 09/01/2017   BMI 44.79 kg/m  Physical  exam-deferred  ASSESSMENT: 1.  Abnormal uterine bleeding 2.  History of PCO 3.  History of right ovarian cyst, persisting 4.  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  8.7 cm simple right ovarian cyst on ultrasound 6.  Patient desires conception  PLAN: 1.  Before attempting Clomid therapy for ovulation induction, 2.  Hysteroscopy/D&C is recommended, along with 3.  Laparoscopy with right ovarian cystectomy, possible right oophorectomy 4.  Consider genetic testing because of family history of breast cancer and ovarian cancer 5.  Roma score to help determine surgical management planning  A total of 15 minutes were spent face-to-face with the patient during this encounter and over half of that time dealt with counseling and coordination of care.  Herold Harms, MD  Note: This dictation was prepared with Dragon dictation along with smaller phrase technology. Any transcriptional errors that result from this process are unintentional.

## 2017-09-17 NOTE — Patient Instructions (Addendum)
1.  Roma score 2.  Laparoscopy with right ovarian cystectomy/possible nephrectomy is scheduled 3.  Hysteroscopy/D&C as scheduled 4.  Continue using Provera 30 mg daily for suppression of bleeding 5.  Return the week prior to surgery for preop appointment 6.  Roma score is ordered   Dilation and Curettage or Vacuum Curettage, Care After These instructions give you information about caring for yourself after your procedure. Your doctor may also give you more specific instructions. Call your doctor if you have any problems or questions after your procedure. Follow these instructions at home: Activity  Do not drive or use heavy machinery while taking prescription pain medicine.  For 24 hours after your procedure, avoid driving.  Take short walks often, followed by rest periods. Ask your doctor what activities are safe for you. After one or two days, you may be able to return to your normal activities.  Do not lift anything that is heavier than 10 lb (4.5 kg) until your doctor approves.  For at least 2 weeks, or as long as told by your doctor: ? Do not douche. ? Do not use tampons. ? Do not have sex. General instructions  Take over-the-counter and prescription medicines only as told by your doctor. This is very important if you take blood thinning medicine.  Do not take baths, swim, or use a hot tub until your doctor approves. Take showers instead of baths.  Wear compression stockings as told by your doctor.  It is up to you to get the results of your procedure. Ask your doctor when your results will be ready.  Keep all follow-up visits as told by your doctor. This is important. Contact a doctor if:  You have very bad cramps that get worse or do not get better with medicine.  You have very bad pain in your belly (abdomen).  You cannot drink fluids without throwing up (vomiting).  You get pain in a different part of the area between your belly and thighs (pelvis).  You have  bad-smelling discharge from your vagina.  You have a rash. Get help right away if:  You are bleeding a lot from your vagina. A lot of bleeding means soaking more than one sanitary pad in an hour, for 2 hours in a row.  You have clumps of blood (blood clots) coming from your vagina.  You have a fever or chills.  Your belly feels very tender or hard.  You have chest pain.  You have trouble breathing.  You cough up blood.  You feel dizzy.  You feel light-headed.  You pass out (faint).  You have pain in your neck or shoulder area. Summary  Take short walks often, followed by rest periods. Ask your doctor what activities are safe for you. After one or two days, you may be able to return to your normal activities.  Do not lift anything that is heavier than 10 lb (4.5 kg) until your doctor approves.  Do not take baths, swim, or use a hot tub until your doctor approves. Take showers instead of baths.  Contact your doctor if you have any symptoms of infection, like bad-smelling discharge from your vagina. This information is not intended to replace advice given to you by your health care provider. Make sure you discuss any questions you have with your health care provider. Document Released: 02/15/2008 Document Revised: 01/24/2016 Document Reviewed: 01/24/2016 Elsevier Interactive Patient Education  2017 [employer]  Diagnostic Laparoscopy, Care After Refer to this sheet in the  next few weeks. These instructions provide you with information about caring for yourself after your procedure. Your health care provider may also give you more specific instructions. Your treatment has been planned according to current medical practices, but problems sometimes occur. Call your health care provider if you have any problems or questions after your procedure. What can I expect after the procedure? After your procedure, it is common to have mild discomfort in the throat and abdomen. Follow  these instructions at home:  Take over-the-counter and prescription medicines only as told by your health care provider.  Do not drive for 24 hours if you received a sedative.  Return to your normal activities as told by your health care provider.  Do not take baths, swim, or use a hot tub until your health care provider approves. You may shower.  Follow instructions from your health care provider about how to take care of your incision. Make sure you: ? Wash your hands with soap and water before you change your bandage (dressing). If soap and water are not available, use hand sanitizer. ? Change your dressing as told by your health care provider. ? Leave stitches (sutures), skin glue, or adhesive strips in place. These skin closures may need to stay in place for 2 weeks or longer. If adhesive strip edges start to loosen and curl up, you may trim the loose edges. Do not remove adhesive strips completely unless your health care provider tells you to do that.  Check your incision area every day for signs of infection. Check for: ? More redness, swelling, or pain. ? More fluid or blood. ? Warmth. ? Pus or a bad smell.  It is your responsibility to get the results of your procedure. Ask your health care provider or the department performing the procedure when your results will be ready. Contact a health care provider if:  There is new pain in your shoulders.  You feel light-headed or faint.  You are unable to pass gas or unable to have a bowel movement.  You feel nauseous or you vomit.  You develop a rash.  You have more redness, swelling, or pain around your incision.  You have more fluid or blood coming from your incision.  Your incision feels warm to the touch.  You have pus or a bad smell coming from your incision.  You have a fever or chills. Get help right away if:  Your pain is getting worse.  You have ongoing vomiting.  The edges of your incision open up.  You  have trouble breathing.  You have chest pain. This information is not intended to replace advice given to you by your health care provider. Make sure you discuss any questions you have with your health care provider. Document Released: 04/19/2015 Document Revised: 10/14/2015 Document Reviewed: 01/19/2015 Elsevier Interactive Patient Education  2018 [employer]. Hysteroscopy Hysteroscopy is a procedure used for looking inside the womb (uterus). It may be done for various reasons, including:  To evaluate abnormal bleeding, fibroid (benign, noncancerous) tumors, polyps, scar tissue (adhesions), and possibly cancer of the uterus.  To look for lumps (tumors) and other uterine growths.  To look for causes of why a woman cannot get pregnant (infertility), causes of recurrent loss of pregnancy (miscarriages), or a lost intrauterine device (IUD).  To perform a sterilization by blocking the fallopian tubes from inside the uterus.  In this procedure, a thin, flexible tube with a tiny light and camera on the end of it (hysteroscope)  is used to look inside the uterus. A hysteroscopy should be done right after a menstrual period to be sure you are not pregnant. LET [facility] CARE PROVIDER KNOW ABOUT:  Any allergies you have.  All medicines you are taking, including vitamins, herbs, eye drops, creams, and over-the-counter medicines.  Previous problems you or members of your family have had with the use of anesthetics.  Any blood disorders you have.  Previous surgeries you have had.  Medical conditions you have. RISKS AND COMPLICATIONS Generally, this is a safe procedure. However, as with any procedure, complications can occur. Possible complications include:  Putting a hole in the uterus.  Excessive bleeding.  Infection.  Damage to the cervix.  Injury to other organs.  Allergic reaction to medicines.  Too much fluid used in the uterus for the procedure.  BEFORE THE  PROCEDURE  Ask your health care provider about changing or stopping any regular medicines.  Do not take aspirin or blood thinners for 1 week before the procedure, or as directed by your health care provider. These can cause bleeding.  If you smoke, do not smoke for 2 weeks before the procedure.  In some cases, a medicine is placed in the cervix the day before the procedure. This medicine makes the cervix have a larger opening (dilate). This makes it easier for the instrument to be inserted into the uterus during the procedure.  Do not eat or drink anything for at least 8 hours before the surgery.  Arrange for someone to take you home after the procedure. PROCEDURE  You may be given a medicine to relax you (sedative). You may also be given one of the following: ? A medicine that numbs the area around the cervix (local anesthetic). ? A medicine that makes you sleep through the procedure (general anesthetic).  The hysteroscope is inserted through the vagina into the uterus. The camera on the hysteroscope sends a picture to a TV screen. This gives the surgeon a good view inside the uterus.  During the procedure, air or a liquid is put into the uterus, which allows the surgeon to see better.  Sometimes, tissue is gently scraped from inside the uterus. These tissue samples are sent to a lab for testing. What to expect after the procedure  If you had a general anesthetic, you may be groggy for a couple hours after the procedure.  If you had a local anesthetic, you will be able to go home as soon as you are stable and feel ready.  You may have some cramping. This normally lasts for a couple days.  You may have bleeding, which varies from light spotting for a few days to menstrual-like bleeding for 3-7 days. This is normal.  If your test results are not back during the visit, make an appointment with your health care provider to find out the results. This information is not intended to  replace advice given to you by your health care provider. Make sure you discuss any questions you have with your health care provider. Document Released: 08/14/2000 Document Revised: 10/14/2015 Document Reviewed: 12/05/2012 Elsevier Interactive Patient Education  2017 [employer]. Dilation and Curettage or Vacuum Curettage, Care After These instructions give you information about caring for yourself after your procedure. Your doctor may also give you more specific instructions. Call your doctor if you have any problems or questions after your procedure. Follow these instructions at home: Activity  Do not drive or use heavy machinery while taking prescription pain medicine.  For 24 hours after your procedure, avoid driving.  Take short walks often, followed by rest periods. Ask your doctor what activities are safe for you. After one or two days, you may be able to return to your normal activities.  Do not lift anything that is heavier than 10 lb (4.5 kg) until your doctor approves.  For at least 2 weeks, or as long as told by your doctor: ? Do not douche. ? Do not use tampons. ? Do not have sex. General instructions  Take over-the-counter and prescription medicines only as told by your doctor. This is very important if you take blood thinning medicine.  Do not take baths, swim, or use a hot tub until your doctor approves. Take showers instead of baths.  Wear compression stockings as told by your doctor.  It is up to you to get the results of your procedure. Ask your doctor when your results will be ready.  Keep all follow-up visits as told by your doctor. This is important. Contact a doctor if:  You have very bad cramps that get worse or do not get better with medicine.  You have very bad pain in your belly (abdomen).  You cannot drink fluids without throwing up (vomiting).  You get pain in a different part of the area between your belly and thighs (pelvis).  You have  bad-smelling discharge from your vagina.  You have a rash. Get help right away if:  You are bleeding a lot from your vagina. A lot of bleeding means soaking more than one sanitary pad in an hour, for 2 hours in a row.  You have clumps of blood (blood clots) coming from your vagina.  You have a fever or chills.  Your belly feels very tender or hard.  You have chest pain.  You have trouble breathing.  You cough up blood.  You feel dizzy.  You feel light-headed.  You pass out (faint).  You have pain in your neck or shoulder area. Summary  Take short walks often, followed by rest periods. Ask your doctor what activities are safe for you. After one or two days, you may be able to return to your normal activities.  Do not lift anything that is heavier than 10 lb (4.5 kg) until your doctor approves.  Do not take baths, swim, or use a hot tub until your doctor approves. Take showers instead of baths.  Contact your doctor if you have any symptoms of infection, like bad-smelling discharge from your vagina. This information is not intended to replace advice given to you by your health care provider. Make sure you discuss any questions you have with your health care provider. Document Released: 02/15/2008 Document Revised: 01/24/2016 Document Reviewed: 01/24/2016 Elsevier Interactive Patient Education  2017 [employer].

## 2017-09-18 ENCOUNTER — Encounter: Payer: BC Managed Care – PPO | Admitting: Obstetrics and Gynecology

## 2017-09-18 ENCOUNTER — Encounter: Payer: Self-pay | Admitting: Obstetrics and Gynecology

## 2017-09-18 LAB — OVARIAN MALIGNANCY RISK-ROMA
Cancer Antigen (CA) 125: 9.3 U/mL (ref 0.0–38.1)
HE4: 38.1 pmol/L (ref 0.0–61.2)
POSTMENOPAUSAL ROMA: 0.65
PREMENOPAUSAL ROMA: 0.39

## 2017-09-18 LAB — POSTMENOPAUSAL INTERP: LOW

## 2017-09-18 LAB — PREMENOPAUSAL INTERP: LOW

## 2017-09-27 ENCOUNTER — Encounter: Payer: Self-pay | Admitting: Obstetrics and Gynecology

## 2017-09-27 ENCOUNTER — Telehealth: Payer: Self-pay | Admitting: Obstetrics and Gynecology

## 2017-09-27 NOTE — Telephone Encounter (Signed)
See my chart message

## 2017-09-27 NOTE — Telephone Encounter (Signed)
Patient called stating she was started on a medication to help her stop bleeding however she is still bleeding and thinks she possibly has a uti. She left work early today due to cramping. She would like a call back. Thanks

## 2017-10-01 ENCOUNTER — Encounter: Payer: BC Managed Care – PPO | Admitting: Obstetrics and Gynecology

## 2017-10-01 ENCOUNTER — Encounter: Payer: Self-pay | Admitting: Obstetrics and Gynecology

## 2017-10-01 ENCOUNTER — Emergency Department: Payer: BC Managed Care – PPO

## 2017-10-01 ENCOUNTER — Emergency Department
Admission: EM | Admit: 2017-10-01 | Discharge: 2017-10-01 | Disposition: A | Discharge status: Home / Self Care | Payer: BC Managed Care – PPO | Attending: Emergency Medicine | Admitting: Emergency Medicine

## 2017-10-01 ENCOUNTER — Encounter: Payer: Self-pay | Admitting: Emergency Medicine

## 2017-10-01 DIAGNOSIS — R1030 Lower abdominal pain, unspecified: Secondary | ICD-10-CM

## 2017-10-01 DIAGNOSIS — Z79899 Other long term (current) drug therapy: Secondary | ICD-10-CM | POA: Insufficient documentation

## 2017-10-01 DIAGNOSIS — R42 Dizziness and giddiness: Secondary | ICD-10-CM | POA: Diagnosis not present

## 2017-10-01 DIAGNOSIS — R109 Unspecified abdominal pain: Secondary | ICD-10-CM | POA: Diagnosis not present

## 2017-10-01 DIAGNOSIS — N938 Other specified abnormal uterine and vaginal bleeding: Secondary | ICD-10-CM | POA: Diagnosis present

## 2017-10-01 LAB — BASIC METABOLIC PANEL
Anion gap: 6 (ref 5–15)
BUN: 11 mg/dL (ref 6–20)
CHLORIDE: 106 mmol/L (ref 101–111)
CO2: 24 mmol/L (ref 22–32)
CREATININE: 0.45 mg/dL (ref 0.44–1.00)
Calcium: 8.8 mg/dL — ABNORMAL LOW (ref 8.9–10.3)
GFR calc Af Amer: >60 mL/min (ref >60)
GFR calc non Af Amer: >60 mL/min (ref >60)
Glucose, Bld: 87 mg/dL (ref 65–99)
Potassium: 4 mmol/L (ref 3.5–5.1)
SODIUM: 136 mmol/L (ref 135–145)

## 2017-10-01 LAB — PROTIME-INR
INR: 0.93
PROTHROMBIN TIME: 12.4 s (ref 11.4–15.2)

## 2017-10-01 LAB — HCG, QUANTITATIVE, PREGNANCY

## 2017-10-01 LAB — CBC
HEMATOCRIT: 33.2 % — AB (ref 35.0–47.0)
HEMOGLOBIN: 11 g/dL — AB (ref 12.0–16.0)
MCH: 25.3 pg — ABNORMAL LOW (ref 26.0–34.0)
MCHC: 33.3 g/dL (ref 32.0–36.0)
MCV: 76 fL — ABNORMAL LOW (ref 80.0–100.0)
Platelets: 363 10*3/uL (ref 150–440)
RBC: 4.36 MIL/uL (ref 3.80–5.20)
RDW: 14.9 % — ABNORMAL HIGH (ref 11.5–14.5)
WBC: 7.9 10*3/uL (ref 3.6–11.0)

## 2017-10-01 LAB — APTT: APTT: 27 s (ref 24–36)

## 2017-10-01 MED ORDER — NORETHIN ACE-ETH ESTRAD-FE 1-20 MG-MCG PO TABS: 1.0000 | ORAL_TABLET | Freq: Every day | ORAL | 2 refills | Status: DC

## 2017-10-01 NOTE — ED Notes (Signed)
Patient transported to Ultrasound 

## 2017-10-01 NOTE — ED Notes (Signed)
Pt still in US

## 2017-10-01 NOTE — ED Provider Notes (Signed)
[facility] Emergency Department Provider Note  ____________________________________________  Time seen: Approximately 9:20 AM  I have reviewed the triage vital signs and the nursing notes.   HISTORY  Chief Complaint Vaginal Bleeding   HPI Pamela Warren is a 27 y.o. female with a history of PCOS, ovarian cyst, and abnormal uterine bleeding who presents for evaluation of vaginal bleeding.  Patient was started on OCPs 3 weeks ago.  The bleeding stopped for a few weeks however restarted again 3 days ago.  Patient reports that she is bleeding very heavy, soaking through clothes and bed sheets and passing large clots the size of her hand.  Today she started feeling dizzy.  She is also complaining of intermittent sharp severe right sided abdominal pain which is where she was diagnosed with a large ovarian cyst.  She has no pain at this time.  No nausea or vomiting, no dysuria or hematuria, no fever or chills.  Patient does report dizziness especially with standing up since this am.   Past Medical History:  Diagnosis Date  . Heavy menstrual bleeding   . Infertility, female   . Ovarian cyst   . PCOS (polycystic ovarian syndrome)     Patient Active Problem List   Diagnosis Date Noted  . Menorrhagia with irregular cycle 09/17/2017  . 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 Cyst of ovary 09/17/2017  . Complex endometrial hyperplasia with atypia 09/17/2017  . Irregular periods/menstrual cycles 08/25/2015  . Morbid obesity with BMI of 40.0-44.9, adult (HCC) 08/25/2015  . Infertility, female 08/25/2015    Past Surgical History:  Procedure Laterality Date  . DILATION AND CURETTAGE OF UTERUS     2015- cyst in tube -Dr Binnie Rail    Prior to Admission medications   Medication Sig Start Date End Date Taking? Authorizing Provider  clomiPHENE (CLOMID) 50 MG tablet Take 2 tablets (100 mg total) by mouth daily. Days 5-9 09/11/17   Defrancesco, Prentice Docker, MD    Multiple Vitamin (MULTIVITAMIN) capsule Take 1 capsule by mouth daily.    [provider]  norethindrone-ethinyl estradiol (JUNEL FE,GILDESS FE,LOESTRIN FE) 1-20 MG-MCG tablet Take 1 tablet by mouth daily. 10/01/17 10/01/18  Nita Sickle, MD    Allergies Patient has no known allergies.  Family History  Problem Relation Age of Onset  . Diabetes Father   . Ovarian cancer Maternal Grandmother   . Breast cancer Maternal Grandmother   . Diabetes Maternal Grandmother   . Breast cancer Paternal Grandmother   . Diabetes Paternal Grandmother   . Colon cancer Paternal Grandfather   . Heart disease Neg Hx     Social History Social History   Tobacco Use  . Smoking status: Never Smoker  . Smokeless tobacco: Never Used  Substance Use Topics  . Alcohol use: Yes    Comment: rare  . Drug use: No    Review of Systems  Constitutional: Negative for fever. +dizziness Eyes: Negative for visual changes. ENT: Negative for sore throat. Neck: No neck pain  Cardiovascular: Negative for chest pain. Respiratory: Negative for shortness of breath. Gastrointestinal: + lower abdominal pain. No vomiting or diarrhea. Genitourinary: Negative for dysuria. + vaginal bleeding Musculoskeletal: Negative for back pain. Skin: Negative for rash. Neurological: Negative for headaches, weakness or numbness. Psych: No SI or HI  ____________________________________________   PHYSICAL EXAM:  VITAL SIGNS: ED Triage Vitals  Enc Vitals Group     BP 10/01/17 0842 128/69     Pulse Rate 10/01/17 0842 (!) 101  Resp 10/01/17 0842 20     Temp 10/01/17 0842 98.4 F (36.9 C)     Temp Source 10/01/17 0842 Oral     SpO2 10/01/17 0842 100 %     Weight 10/01/17 0843 235 lb (106.6 kg)     Height 10/01/17 0843  (1.6 m)     Head Circumference --      Peak Flow --      Pain Score 10/01/17 0845 5     Pain Loc --      Pain Edu? --      Excl. in GC? --     Orthostatic vitals: Laying  HR  85      BP 108/62 Standing  HR 88   BP 115/59  Constitutional: Alert and oriented. Well appearing and in no apparent distress. HEENT:      Head: Normocephalic and atraumatic.         Eyes: Conjunctivae are normal. Sclera is non-icteric.       Mouth/Throat: Mucous membranes are moist.       Neck: Supple with no signs of meningismus. Cardiovascular: Tachycardic with regular rhythm. No murmurs, gallops, or rubs. 2+ symmetrical distal pulses are present in all extremities. No JVD. Respiratory: Normal respiratory effort. Lungs are clear to auscultation bilaterally. No wheezes, crackles, or rhonchi.  Gastrointestinal: Soft, non tender, and non distended with positive bowel sounds. No rebound or guarding. Genitourinary: No CVA tenderness. Musculoskeletal: Nontender with normal range of motion in all extremities. No edema, cyanosis, or erythema of extremities. Neurologic: Normal speech and language. Face is symmetric. Moving all extremities. No gross focal neurologic deficits are appreciated. Skin: Skin is warm, dry and intact. No rash noted. Psychiatric: Mood and affect are normal. Speech and behavior are normal.  ____________________________________________   LABS (all labs ordered are listed, but only abnormal results are displayed)  Labs Reviewed  CBC - Abnormal; Notable for the following components:      Result Value   Hemoglobin 11.0 (*)    HCT 33.2 (*)    MCV 76.0 (*)    MCH 25.3 (*)    RDW 14.9 (*)    All other components within normal limits  BASIC METABOLIC PANEL - Abnormal; Notable for the following components:   Calcium 8.8 (*)    All other components within normal limits  HCG, QUANTITATIVE, PREGNANCY  PROTIME-INR  APTT   ____________________________________________  EKG  None  ____________________________________________  RADIOLOGY  I have personally reviewed the images performed during this visit and I agree with the Radiologist's read.   Interpretation by  Radiologist:  US Pelvic Complete W Transvaginal And Torsion R/o  Result Date: 10/01/2017 CLINICAL DATA:  Lower abdominal pain, vaginal bleeding EXAM: TRANSABDOMINAL AND TRANSVAGINAL ULTRASOUND OF PELVIS TECHNIQUE: Both transabdominal and transvaginal ultrasound examinations of the pelvis were performed. Transabdominal technique was performed for global imaging of the pelvis including uterus, ovaries, adnexal regions, and pelvic cul-de-sac. It was necessary to proceed with endovaginal exam following the transabdominal exam to visualize the endometrium and right ovary. COMPARISON:  09/13/2017 FINDINGS: Uterus Measurements: 8.7 x 4.9 x 6.8 cm. No fibroids or other mass visualized. Endometrium Thickness: 19 mm in thickness.  No focal abnormality visualized. Right ovary Measurements: 9.1 x 4.8 x 5.4 cm. Large cyst in the right ovary measures 9.0 x 6.9 x 6.6 cm. No internal blood flow or septations. Left ovary Measurements: 4.7 x 2.7 x 2.7 cm. Normal appearance/no adnexal mass. Other findings No abnormal free fluid. IMPRESSION: No focal  uterine abnormality. Large 9 cm cyst in the right ovary. Since these may be difficult to assess completely with [place], further evaluation of simple-appearing cysts >7 cm with MRI or surgical evaluation is recommended according to the Society of Radiologists in Ultrasound 2010 Consensus Conference Statement (D Lenis Noon et al. Management of Asymptomatic Ovarian and other Adnexal Cysts Imaged at [place]: Society of Radiologists in Ultrasound Consensus Conference Statement 2010. Radiology 256 (Sept 2010): 943-954.). Electronically Signed   By: Charlett Nose M.D.   On: 10/01/2017 11:33      ____________________________________________   PROCEDURES  Procedure(s) performed: None Procedures Critical Care performed:  None ____________________________________________   INITIAL IMPRESSION / ASSESSMENT AND PLAN / ED COURSE   27 y.o. female with a history of PCOS, ovarian cyst, and abnormal  uterine bleeding who presents for evaluation of vaginal bleeding, dizziness, and R sided abdominal pain.  Patient is well-appearing, no distress, normal vital signs, negative orthostatic vital signs, abdomen is soft with no tenderness.  Since patient has a history of PCOS I will send her for a transvaginal ultrasound with Dopplers to rule out intermittent ovarian torsion.  Pain is most likely cramping from vaginal bleeding.  hCG is negative. hgb is stable at 11. Normal coags. Will discuss patient with [facility]. No indication for blood transfusion at this time.     _________________________ 10:09 AM on 10/01/2017 -----------------------------------------  Spoke with Dr. Cordelia Pen, OB/GYN on-call who recommended dc'ing the Provera and started patient on Junel birth control on a taper  _________________________ 11:49 AM on 10/01/2017 -----------------------------------------  Ultrasound consistent with a large ovarian cysts but no evidence of torsion.  Patient remains without any pain in the emergency room.  I discussed signs and symptoms of torsion with her and recommended emergent evaluation if these develop.  Also discussed stopping the Provera and started birth control with taper.  Patient is comfortable with this plan.  Also recommended close follow-up with OB/GYN.  Discussed signs and symptoms of acute blood loss anemia and recommended return to the emergency room if these develop.  At this time patient is stable for discharge  As part of my medical decision making, I reviewed the following data within the electronic MEDICAL RECORD NUMBER Nursing notes reviewed and incorporated, Labs reviewed , Old chart reviewed, Radiograph reviewed , A consult was requested and obtained from this/these consultant(s) ObGYN, Notes from prior ED visits and [place] Controlled Substance Database    Pertinent labs & imaging results that were available during my care of the patient were reviewed by me and considered in my  medical decision making (see chart for details).    ____________________________________________   FINAL CLINICAL IMPRESSION(S) / ED DIAGNOSES  Final diagnoses:  Lower abdominal pain  Dysfunctional uterine bleeding      NEW MEDICATIONS STARTED DURING THIS VISIT:  ED Discharge Orders        Ordered    norethindrone-ethinyl estradiol (JUNEL FE,GILDESS FE,LOESTRIN FE) 1-20 MG-MCG tablet  Daily     10/01/17 1147       Note:  This document was prepared using Dragon voice recognition software and may include unintentional dictation errors.    Don Perking, [place], MD 10/01/17 1150

## 2017-10-01 NOTE — Discharge Instructions (Signed)
Take 3 birth control pills a day for 3 days, then 2 pills a day for 3 days and then 1 pill a day. Skip the sugar pills in the end of each package and start the new package right away.  Follow-up with your OB/GYN within a week.  Return to the emergency room if your bleeding does not stop, if you look pale, if you pass out, or if you are having chest pain or shortness of breath. Stop provera

## 2017-10-01 NOTE — ED Triage Notes (Signed)
Pt reports is having some heavy vaginal bleeding. Pt scheduled for procedure due to heavy bleeding in June. Pt is seen at [facility] and is aware of her bleeding issues.

## 2017-10-02 ENCOUNTER — Encounter: Payer: Self-pay | Admitting: Obstetrics and Gynecology

## 2017-10-03 ENCOUNTER — Encounter: Payer: Self-pay | Admitting: Obstetrics and Gynecology

## 2017-10-03 ENCOUNTER — Ambulatory Visit (INDEPENDENT_AMBULATORY_CARE_PROVIDER_SITE_OTHER): Payer: BC Managed Care – PPO | Admitting: Obstetrics and Gynecology

## 2017-10-03 ENCOUNTER — Encounter: Payer: BC Managed Care – PPO | Admitting: Obstetrics and Gynecology

## 2017-10-03 VITALS — BP 116/74 | HR 81 | Ht 63.0 in | Wt 240.0 lb

## 2017-10-03 DIAGNOSIS — N921 Excessive and frequent menstruation with irregular cycle: Secondary | ICD-10-CM

## 2017-10-03 DIAGNOSIS — Z6841 Body Mass Index (BMI) 40.0 and over, adult: Secondary | ICD-10-CM | POA: Diagnosis not present

## 2017-10-03 DIAGNOSIS — N8502 Endometrial intraepithelial neoplasia [EIN]: Secondary | ICD-10-CM | POA: Diagnosis not present

## 2017-10-03 DIAGNOSIS — R102 Pelvic and perineal pain: Secondary | ICD-10-CM

## 2017-10-03 DIAGNOSIS — N83209 Unspecified ovarian cyst, unspecified side: Secondary | ICD-10-CM | POA: Diagnosis not present

## 2017-10-03 NOTE — Patient Instructions (Signed)
1.  Patient is scheduled for surgery 10/05/2017 2.  Patient will be seen by anesthesia for preop appointment 3.  Patient will return 1 week after surgery for postop check

## 2017-10-03 NOTE — H&P (Signed)
PREOPERATIVE HISTORY AND PHYSICAL  Date of surgery: 10/05/2017 Diagnosis: 1.  Menorrhagia 2.  Complex atypical endometrial hyperplasia on endometrial biopsy 3.  Pelvic pain 4.  9 cm simple right ovarian cyst, symptomatic   Patient is a 27 y.o. [id number] scheduled for surgery on 10/05/2016. Patient has history of PCO D, obesity, and primary infertility.  During her work-up she was noted to have ultrasound; endometrial biopsy confirmed complex atypical endometrial hyperplasia.  She did have a history of a large right cyst on prior CT scan; subsequent ultrasound on 10/04/2017 demonstrated a 9 cm simple right cyst; Roma score was low consistent with probable benign.  Patient had recurrence of menometrorrhagia with clots and pelvic pain prompting an ER visit over the past weekend.  Because she is still experiencing the severe cramping and bleeding despite switch of progestin therapy to high-dose OCP therapy, she is to proceed with surgery which had been previously scheduled for later in  June 2019.  Gynecologic History Patient's last menstrual period: 10/01/2017 Contraception: none Last Pap: Normal Last mammogram: NA  Menarche: Age 8 Intervals: Monthly, regular until 2-1/2 years ago; now every 4-7 weeks Duration: 5-7 days, mild to moderate bleeding Dysmenorrhea: Mild, not requiring medication; never missed school or work Dyspareunia: Negative Pap smear history: No abnormals PID STI history: None  OB History    Gravida  0   Para  0   Term  0   Preterm  0   AB  0   Living  0     SAB  0   TAB  0   Ectopic  0   Multiple  0   Live Births              Patient's last menstrual period was 10/01/2017 (exact date).    Past Medical History:  Diagnosis Date  . Heavy menstrual bleeding   . Infertility, female   . Ovarian cyst   . PCOS (polycystic ovarian syndrome)     Past Surgical History:  Procedure Laterality Date  . DILATION AND CURETTAGE OF UTERUS     2015-  cyst in tube -Dr Jean Rosenthal [facility]    OB History  Gravida Para Term Preterm AB Living  0 0 0 0 0 0  SAB TAB Ectopic Multiple Live Births  0 0 0 0      Social History   Socioeconomic History  . Marital status: Married    Spouse name: Not on file  . Number of children: Not on file  . Years of education: Not on file  . Highest education level: Not on file  Occupational History  . Not on file  Social Needs  . Financial resource strain: Not on file  . Food insecurity:    Worry: Not on file    Inability: Not on file  . Transportation needs:    Medical: Not on file    Non-medical: Not on file  Tobacco Use  . Smoking status: Never Smoker  . Smokeless tobacco: Never Used  Substance and Sexual Activity  . Alcohol use: Yes    Comment: rare  . Drug use: No  . Sexual activity: Yes    Birth control/protection: None  Lifestyle  . Physical activity:    Days per week: Not on file    Minutes per session: Not on file  . Stress: Not on file  Relationships  . Social connections:    Talks on phone: Not on file    Gets together: Not on file  Attends religious service: Not on file    Active member of club or organization: Not on file    Attends meetings of clubs or organizations: Not on file    Relationship status: Not on file  Other Topics Concern  . Not on file  Social History Narrative  . Not on file    Family History  Problem Relation Age of Onset  . Diabetes Father   . Ovarian cancer Maternal Grandmother   . Breast cancer Maternal Grandmother   . Diabetes Maternal Grandmother   . Breast cancer Paternal Grandmother   . Diabetes Paternal Grandmother   . Colon cancer Paternal Grandfather   . Heart disease Neg Hx      (Not in a hospital admission)  No Known Allergies  Review of Systems Constitutional: No recent fever/chills/sweats Respiratory: No recent cough/bronchitis Cardiovascular: No chest pain Gastrointestinal: No recent nausea/vomiting/diarrhea Genitourinary:  No UTI symptoms Hematologic/lymphatic:No history of coagulopathy or recent blood thinner use    Objective:    BP 116/74   Pulse 81   Ht  (1.6 m)   Wt 240 lb (108.9 kg)   LMP 10/01/2017 (Exact Date)   BMI 42.51 kg/m   General:   Normal  Skin:   normal  HEENT:  Normal  Neck:  Supple without Adenopathy or Thyromegaly  Lungs:   Heart:              Breasts:   Abdomen:  Pelvis:  M/S   Extremeties:  Neuro:    clear to auscultation bilaterally   Normal without murmur   Not Examined   soft, non-tender; bowel sounds normal; no masses,  no organomegaly   Exam deferred to OR  No CVAT  Warm/Dry   Normal        4/23/2019PELVIC:             External Genitalia: Normal             BUS: Normal             Vagina: Normal             Cervix: Normal; no lesions; no cervical motion tenderness             Uterus: Normal size, shape,consistency, mobile, midplane             Adnexa: Normal; nonpalpable, non tender             RV: Normal external exam             Bladder: Nontender    Assessment:     1.  Menometrorrhagia 2.  Complex atypical endometrial hyperplasia on biopsy of endometrium 3.  Pelvic pain 4.  9 cm right ovarian cyst, simple, symptomatic 5.  Morbid obesity 6.  Primary infertility   Plan:  1.  Hysteroscopy/D&C 2.  Skippy with right ovarian cystectomy, possible right oophorectomy   Preop counseling: Patient is to undergo hysteroscopy/D&C for evaluation and management of menometrorrhagia with preoperative biopsy demonstrated complex atypical endometrial hyperplasia.  She also is to undergo laparoscopy with right ovarian cystectomy for management of asymptomatic simple right ovarian cyst measuring 9 cm on ultrasound; Roma score was consistent with benign disease. She has been counseled regarding the potential surgical risks and is accepting of all risks which include but are not limited to bleeding, infection, pelvic organ injury with need for repair, blood clot  disorders, anesthesia risk, possible loss of right ovary.  All questions have been answered.  Informed as  it is given.  Patient is ready willing to proceed with surgery as scheduled.  Herold Harms, MD  Note: This dictation was prepared with Dragon dictation along with smaller phrase technology. Any transcriptional errors that result from this process are unintentional.

## 2017-10-03 NOTE — Progress Notes (Signed)
Chief complaint: 1.  Menorrhagia 2.  History of ovarian cyst, simple 3.  Pelvic pain  Pamela Warren presents today for follow-up.  She was seen in the emergency room because of new onset heavy vaginal bleeding with clots over several days.  She denies significant anemia symptoms at this time.  Bleeding has diminished.  However, she is desiring to proceed with surgery soon as possible that had previously been planned for June 2019. 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  Pelvic cramping also has been not well controlled with ibuprofen.  Previous work-up has included ultrasound showing a thickened endometrium and endometrial biopsy showing complex endometrial hyperplasia with atypia.  She had been on Provera but continued to bleed; after the ER visit she was placed on high-dose oral contraceptive regimen. Ultrasound from 10/01/2017 did demonstrate a simple right ovarian cyst measuring 9 cm; Pamela Warren score was consistent with low risk for malignancy.  Preop exam is completed today and she is scheduled for surgery on 10/05/2017. Procedures to be performed include: 1.  Hysteroscopy/D&C 2.  Laparoscopy with right ovarian cystectomy possible oophorectomy  See separate preop note and counseling note.  OBJECTIVE: BP 116/74   Pulse 81   Ht  (1.6 m)   Wt 240 lb (108.9 kg)   LMP 10/01/2017 (Exact Date)   BMI 42.51 kg/m  Well-appearing female in no acute distress; anxious and tearful Abdomen: Soft, nontender Lungs: Clear Heart: Regular rate and rhythm without murmur Pelvic: Deferred  ASSESSMENT: 1.  Menometrorrhagia, poorly controlled with Provera, currently on high-dose OCP regimen 2.  Complex atypical endometrial hyperplasia recent endometrial biopsy 3.  9 cm complex right ovarian cyst, symptomatic  PLAN: 1.  Hysteroscopy/D&C 2.  Laparoscopy with right ovarian cystectomy, possible oophorectomy 3.  Surgery as  scheduled and preop appointment is completed today  A total of 15 minutes were spent face-to-face with the patient during this encounter and over half of that time dealt with counseling and coordination of care.  Herold Harms, MD  Note: This dictation was prepared with Dragon dictation along with smaller phrase technology. Any transcriptional errors that result from this process are unintentional.

## 2017-10-03 NOTE — H&P (View-Only) (Signed)
PREOPERATIVE HISTORY AND PHYSICAL  Date of surgery: 10/05/2017 Diagnosis: 1.  Menorrhagia 2.  Complex atypical endometrial hyperplasia on endometrial biopsy 3.  Pelvic pain 4.  9 cm simple right ovarian cyst, symptomatic   Patient is a 27 y.o. G0P0000female scheduled for surgery on 10/05/2016. Patient has history of PCO D, obesity, and primary infertility.  During her work-up she was noted to have ultrasound; endometrial biopsy confirmed complex atypical endometrial hyperplasia.  She did have a history of a large right cyst on prior CT scan; subsequent ultrasound on 10/04/2017 demonstrated a 9 cm simple right cyst; Roma score was low consistent with probable benign.  Patient had recurrence of menometrorrhagia with clots and pelvic pain prompting an ER visit over the past weekend.  Because she is still experiencing the severe cramping and bleeding despite switch of progestin therapy to high-dose OCP therapy, she is to proceed with surgery which had been previously scheduled for later in  June 2019.  Gynecologic History Patient's last menstrual period: 10/01/2017 Contraception: none Last Pap: Normal Last mammogram: NA  Menarche: Age 12 Intervals: Monthly, regular until 2-1/2 years ago; now every 4-7 weeks Duration: 5-7 days, mild to moderate bleeding Dysmenorrhea: Mild, not requiring medication; never missed school or work Dyspareunia: Negative Pap smear history: No abnormals PID STI history: None  OB History    Gravida  0   Para  0   Term  0   Preterm  0   AB  0   Living  0     SAB  0   TAB  0   Ectopic  0   Multiple  0   Live Births              Patient's last menstrual period was 10/01/2017 (exact date).    Past Medical History:  Diagnosis Date  . Heavy menstrual bleeding   . Infertility, female   . Ovarian cyst   . PCOS (polycystic ovarian syndrome)     Past Surgical History:  Procedure Laterality Date  . DILATION AND CURETTAGE OF UTERUS     2015-  cyst in tube -Dr jackson [facility]    OB History  Gravida Para Term Preterm AB Living  0 0 0 0 0 0  SAB TAB Ectopic Multiple Live Births  0 0 0 0      Social History   Socioeconomic History  . Marital status: Married    Spouse name: Not on file  . Number of children: Not on file  . Years of education: Not on file  . Highest education level: Not on file  Occupational History  . Not on file  Social Needs  . Financial resource strain: Not on file  . Food insecurity:    Worry: Not on file    Inability: Not on file  . Transportation needs:    Medical: Not on file    Non-medical: Not on file  Tobacco Use  . Smoking status: Never Smoker  . Smokeless tobacco: Never Used  Substance and Sexual Activity  . Alcohol use: Yes    Comment: rare  . Drug use: No  . Sexual activity: Yes    Birth control/protection: None  Lifestyle  . Physical activity:    Days per week: Not on file    Minutes per session: Not on file  . Stress: Not on file  Relationships  . Social connections:    Talks on phone: Not on file    Gets together: Not on file      Attends religious service: Not on file    Active member of club or organization: Not on file    Attends meetings of clubs or organizations: Not on file    Relationship status: Not on file  Other Topics Concern  . Not on file  Social History Narrative  . Not on file    Family History  Problem Relation Age of Onset  . Diabetes Father   . Ovarian cancer Maternal Grandmother   . Breast cancer Maternal Grandmother   . Diabetes Maternal Grandmother   . Breast cancer Paternal Grandmother   . Diabetes Paternal Grandmother   . Colon cancer Paternal Grandfather   . Heart disease Neg Hx      (Not in a hospital admission)  No Known Allergies  Review of Systems Constitutional: No recent fever/chills/sweats Respiratory: No recent cough/bronchitis Cardiovascular: No chest pain Gastrointestinal: No recent nausea/vomiting/diarrhea Genitourinary:  No UTI symptoms Hematologic/lymphatic:No history of coagulopathy or recent blood thinner use    Objective:    BP 116/74   Pulse 81   Ht 5' 3" (1.6 m)   Wt 240 lb (108.9 kg)   LMP 10/01/2017 (Exact Date)   BMI 42.51 kg/m   General:   Normal  Skin:   normal  HEENT:  Normal  Neck:  Supple without Adenopathy or Thyromegaly  Lungs:   Heart:              Breasts:   Abdomen:  Pelvis:  M/S   Extremeties:  Neuro:    clear to auscultation bilaterally   Normal without murmur   Not Examined   soft, non-tender; bowel sounds normal; no masses,  no organomegaly   Exam deferred to OR  No CVAT  Warm/Dry   Normal        4/23/2019PELVIC:             External Genitalia: Normal             BUS: Normal             Vagina: Normal             Cervix: Normal; no lesions; no cervical motion tenderness             Uterus: Normal size, shape,consistency, mobile, midplane             Adnexa: Normal; nonpalpable, non tender             RV: Normal external exam             Bladder: Nontender    Assessment:     1.  Menometrorrhagia 2.  Complex atypical endometrial hyperplasia on biopsy of endometrium 3.  Pelvic pain 4.  9 cm right ovarian cyst, simple, symptomatic 5.  Morbid obesity 6.  Primary infertility   Plan:  1.  Hysteroscopy/D&C 2.  Skippy with right ovarian cystectomy, possible right oophorectomy   Preop counseling: Patient is to undergo hysteroscopy/D&C for evaluation and management of menometrorrhagia with preoperative biopsy demonstrated complex atypical endometrial hyperplasia.  She also is to undergo laparoscopy with right ovarian cystectomy for management of asymptomatic simple right ovarian cyst measuring 9 cm on ultrasound; Roma score was consistent with benign disease. She has been counseled regarding the potential surgical risks and is accepting of all risks which include but are not limited to bleeding, infection, pelvic organ injury with need for repair, blood clot  disorders, anesthesia risk, possible loss of right ovary.  All questions have been answered.  Informed as   it is given.  Patient is ready willing to proceed with surgery as scheduled.  Jeshurun Oaxaca A Dalyn Becker, MD  Note: This dictation was prepared with Dragon dictation along with smaller phrase technology. Any transcriptional errors that result from this process are unintentional.  

## 2017-10-05 ENCOUNTER — Ambulatory Visit
Admission: RE | Admit: 2017-10-05 | Discharge: 2017-10-05 | Disposition: A | Discharge status: Home / Self Care | Payer: BC Managed Care – PPO | Source: Ambulatory Visit | Attending: Obstetrics and Gynecology | Admitting: Obstetrics and Gynecology

## 2017-10-05 ENCOUNTER — Encounter
Admission: RE | Disposition: A | Discharge status: Home / Self Care | Payer: Self-pay | Source: Ambulatory Visit | Attending: Obstetrics and Gynecology

## 2017-10-05 ENCOUNTER — Ambulatory Visit: Payer: BC Managed Care – PPO | Admitting: Anesthesiology

## 2017-10-05 ENCOUNTER — Other Ambulatory Visit: Payer: Self-pay

## 2017-10-05 ENCOUNTER — Encounter: Payer: Self-pay | Admitting: Anesthesiology

## 2017-10-05 DIAGNOSIS — N8301 Follicular cyst of right ovary: Secondary | ICD-10-CM | POA: Diagnosis not present

## 2017-10-05 DIAGNOSIS — N838 Other noninflammatory disorders of ovary, fallopian tube and broad ligament: Secondary | ICD-10-CM | POA: Diagnosis not present

## 2017-10-05 DIAGNOSIS — N736 Female pelvic peritoneal adhesions (postinfective): Secondary | ICD-10-CM | POA: Diagnosis not present

## 2017-10-05 DIAGNOSIS — E282 Polycystic ovarian syndrome: Secondary | ICD-10-CM | POA: Insufficient documentation

## 2017-10-05 DIAGNOSIS — Z6841 Body Mass Index (BMI) 40.0 and over, adult: Secondary | ICD-10-CM | POA: Insufficient documentation

## 2017-10-05 DIAGNOSIS — N8502 Endometrial intraepithelial neoplasia [EIN]: Secondary | ICD-10-CM | POA: Insufficient documentation

## 2017-10-05 DIAGNOSIS — N920 Excessive and frequent menstruation with regular cycle: Secondary | ICD-10-CM | POA: Diagnosis not present

## 2017-10-05 DIAGNOSIS — N839 Noninflammatory disorder of ovary, fallopian tube and broad ligament, unspecified: Secondary | ICD-10-CM | POA: Diagnosis not present

## 2017-10-05 HISTORY — PX: LAPAROSCOPIC LYSIS OF ADHESIONS: SHX5905

## 2017-10-05 HISTORY — PX: LAPAROSCOPIC APPENDECTOMY: SHX408

## 2017-10-05 HISTORY — PX: LAPAROSCOPIC SALPINGO OOPHERECTOMY: SHX5927

## 2017-10-05 HISTORY — PX: LAPAROSCOPIC OVARIAN CYSTECTOMY: SHX6248

## 2017-10-05 HISTORY — PX: HYSTEROSCOPY WITH D & C: SHX1775

## 2017-10-05 LAB — CBC WITH DIFFERENTIAL/PLATELET
Basophils Absolute: 0 10*3/uL (ref 0–0.1)
Basophils Relative: 0 %
EOS ABS: 0.1 10*3/uL (ref 0–0.7)
EOS PCT: 1 %
HEMATOCRIT: 29.3 % — AB (ref 35.0–47.0)
HEMOGLOBIN: 9.5 g/dL — AB (ref 12.0–16.0)
LYMPHS ABS: 1.8 10*3/uL (ref 1.0–3.6)
Lymphocytes Relative: 21 %
MCH: 24.6 pg — ABNORMAL LOW (ref 26.0–34.0)
MCHC: 32.4 g/dL (ref 32.0–36.0)
MCV: 76 fL — ABNORMAL LOW (ref 80.0–100.0)
MONOS PCT: 8 %
Monocytes Absolute: 0.7 10*3/uL (ref 0.2–0.9)
NEUTROS PCT: 70 %
Neutro Abs: 6.1 10*3/uL (ref 1.4–6.5)
Platelets: 368 10*3/uL (ref 150–440)
RBC: 3.86 MIL/uL (ref 3.80–5.20)
RDW: 15.4 % — ABNORMAL HIGH (ref 11.5–14.5)
WBC: 8.7 10*3/uL (ref 3.6–11.0)

## 2017-10-05 LAB — RAPID HIV SCREEN (HIV 1/2 AB+AG)
HIV 1/2 ANTIBODIES: NONREACTIVE
HIV-1 P24 ANTIGEN - HIV24: NONREACTIVE

## 2017-10-05 LAB — TYPE AND SCREEN
ABO/RH(D): O POS
ANTIBODY SCREEN: NEGATIVE

## 2017-10-05 LAB — POCT PREGNANCY, URINE: Preg Test, Ur: NEGATIVE

## 2017-10-05 SURGERY — DILATATION AND CURETTAGE /HYSTEROSCOPY
Anesthesia: General | Site: Uterus | Laterality: Right | Wound class: Clean Contaminated

## 2017-10-05 MED ORDER — FAMOTIDINE 20 MG PO TABS: 20.0000 mg | ORAL_TABLET | Freq: Once | ORAL | Status: DC

## 2017-10-05 MED ORDER — KETOROLAC TROMETHAMINE 30 MG/ML IJ SOLN
INTRAMUSCULAR | Status: DC | PRN
  Administered 2017-10-05: 30 mg via INTRAVENOUS

## 2017-10-05 MED ORDER — MIDAZOLAM HCL 2 MG/2ML IJ SOLN
INTRAMUSCULAR | Status: DC | PRN
  Administered 2017-10-05: 2 mg via INTRAVENOUS

## 2017-10-05 MED ORDER — ESMOLOL HCL 100 MG/10ML IV SOLN
INTRAVENOUS | Status: DC | PRN
  Administered 2017-10-05 (×2): 20 mg via INTRAVENOUS
  Administered 2017-10-05: 30 mg via INTRAVENOUS

## 2017-10-05 MED ORDER — CEFAZOLIN SODIUM 1 G IJ SOLR
INTRAMUSCULAR | Status: AC
  Filled 2017-10-05: qty 20

## 2017-10-05 MED ORDER — CEFAZOLIN SODIUM-DEXTROSE 1-4 GM/50ML-% IV SOLN
INTRAVENOUS | Status: DC | PRN
  Administered 2017-10-05: 2 g via INTRAVENOUS

## 2017-10-05 MED ORDER — ONDANSETRON HCL 4 MG/2ML IJ SOLN
INTRAMUSCULAR | Status: AC
  Filled 2017-10-05: qty 2

## 2017-10-05 MED ORDER — PHENYLEPHRINE HCL 10 MG/ML IJ SOLN
INTRAMUSCULAR | Status: DC | PRN
  Administered 2017-10-05 (×7): 100 ug via INTRAVENOUS

## 2017-10-05 MED ORDER — LACTATED RINGERS IV SOLN
INTRAVENOUS | Status: DC
  Administered 2017-10-05 (×2): via INTRAVENOUS

## 2017-10-05 MED ORDER — BUPIVACAINE-EPINEPHRINE (PF) 0.5% -1:200000 IJ SOLN
INTRAMUSCULAR | Status: AC
  Filled 2017-10-05: qty 30

## 2017-10-05 MED ORDER — SUGAMMADEX SODIUM 500 MG/5ML IV SOLN
INTRAVENOUS | Status: DC | PRN
  Administered 2017-10-05: 400 mg via INTRAVENOUS

## 2017-10-05 MED ORDER — FENTANYL CITRATE (PF) 100 MCG/2ML IJ SOLN
INTRAMUSCULAR | Status: DC | PRN
  Administered 2017-10-05 (×4): 50 ug via INTRAVENOUS

## 2017-10-05 MED ORDER — FENTANYL CITRATE (PF) 100 MCG/2ML IJ SOLN
25.0000 ug | INTRAMUSCULAR | Status: AC | PRN
  Administered 2017-10-05 (×6): 25 ug via INTRAVENOUS

## 2017-10-05 MED ORDER — ONDANSETRON HCL 4 MG/2ML IJ SOLN
4.0000 mg | Freq: Once | INTRAMUSCULAR | Status: AC | PRN
  Administered 2017-10-05: 4 mg via INTRAVENOUS

## 2017-10-05 MED ORDER — FENTANYL CITRATE (PF) 100 MCG/2ML IJ SOLN
INTRAMUSCULAR | Status: AC
  Administered 2017-10-05: 25 ug via INTRAVENOUS
  Filled 2017-10-05: qty 2

## 2017-10-05 MED ORDER — DEXAMETHASONE SODIUM PHOSPHATE 10 MG/ML IJ SOLN
INTRAMUSCULAR | Status: DC | PRN
  Administered 2017-10-05 (×2): 10 mg via INTRAVENOUS

## 2017-10-05 MED ORDER — BUPIVACAINE-EPINEPHRINE (PF) 0.5% -1:200000 IJ SOLN
INTRAMUSCULAR | Status: DC | PRN
  Administered 2017-10-05: 19 mL

## 2017-10-05 MED ORDER — OXYCODONE-ACETAMINOPHEN 5-325 MG PO TABS: 1.0000 | ORAL_TABLET | ORAL | 0 refills | Status: DC | PRN

## 2017-10-05 MED ORDER — ACETAMINOPHEN 10 MG/ML IV SOLN
INTRAVENOUS | Status: DC | PRN
  Administered 2017-10-05: 1000 mg via INTRAVENOUS

## 2017-10-05 MED ORDER — ONDANSETRON HCL 4 MG/2ML IJ SOLN
INTRAMUSCULAR | Status: DC | PRN
  Administered 2017-10-05: 4 mg via INTRAVENOUS

## 2017-10-05 MED ORDER — ROCURONIUM BROMIDE 100 MG/10ML IV SOLN
INTRAVENOUS | Status: DC | PRN
  Administered 2017-10-05: 20 mg via INTRAVENOUS
  Administered 2017-10-05: 30 mg via INTRAVENOUS

## 2017-10-05 MED ORDER — HEPARIN SODIUM (PORCINE) 5000 UNIT/ML IJ SOLN
INTRAMUSCULAR | Status: AC
  Filled 2017-10-05: qty 1

## 2017-10-05 MED ORDER — HYDROMORPHONE HCL 1 MG/ML IJ SOLN
INTRAMUSCULAR | Status: AC
  Administered 2017-10-05: 0.5 mg via INTRAVENOUS
  Filled 2017-10-05: qty 1

## 2017-10-05 MED ORDER — ACETAMINOPHEN 10 MG/ML IV SOLN
INTRAVENOUS | Status: AC
Start: 2017-10-05 — End: ?
  Filled 2017-10-05: qty 100

## 2017-10-05 MED ORDER — FENTANYL CITRATE (PF) 100 MCG/2ML IJ SOLN
INTRAMUSCULAR | Status: AC
  Filled 2017-10-05: qty 2

## 2017-10-05 MED ORDER — SUCCINYLCHOLINE CHLORIDE 20 MG/ML IJ SOLN
INTRAMUSCULAR | Status: DC | PRN
  Administered 2017-10-05: 100 mg via INTRAVENOUS

## 2017-10-05 MED ORDER — IBUPROFEN 800 MG PO TABS: 800.0000 mg | ORAL_TABLET | Freq: Three times a day (TID) | ORAL | 0 refills | Status: DC

## 2017-10-05 MED ORDER — OXYCODONE HCL 5 MG PO TABS
ORAL_TABLET | ORAL | Status: AC
  Administered 2017-10-05: 5 mg via ORAL
  Filled 2017-10-05: qty 1

## 2017-10-05 MED ORDER — HYDROMORPHONE HCL 1 MG/ML IJ SOLN
0.5000 mg | Freq: Once | INTRAMUSCULAR | Status: AC
  Administered 2017-10-05: 0.5 mg via INTRAVENOUS

## 2017-10-05 MED ORDER — LIDOCAINE HCL (CARDIAC) PF 100 MG/5ML IV SOSY
PREFILLED_SYRINGE | INTRAVENOUS | Status: DC | PRN
  Administered 2017-10-05: 100 mg via INTRAVENOUS

## 2017-10-05 MED ORDER — PROPOFOL 10 MG/ML IV BOLUS
INTRAVENOUS | Status: DC | PRN
  Administered 2017-10-05: 200 mg via INTRAVENOUS

## 2017-10-05 MED ORDER — OXYCODONE HCL 5 MG PO TABS
5.0000 mg | ORAL_TABLET | Freq: Once | ORAL | Status: AC
  Administered 2017-10-05: 5 mg via ORAL

## 2017-10-05 MED ORDER — MIDAZOLAM HCL 2 MG/2ML IJ SOLN
INTRAMUSCULAR | Status: AC
  Filled 2017-10-05: qty 2

## 2017-10-05 SURGICAL SUPPLY — 43 items
ANCHOR TIS RET SYS 1550ML (BAG) ×5 IMPLANT
ANCHOR TIS RET SYS 235ML (MISCELLANEOUS) ×5 IMPLANT
BAG INFUSER PRESSURE 100CC (MISCELLANEOUS) ×5 IMPLANT
BLADE SURG SZ11 CARB STEEL (BLADE) ×5 IMPLANT
CANISTER SUCT 1200ML W/VALVE (MISCELLANEOUS) ×5 IMPLANT
CATH ROBINSON RED A/P 16FR (CATHETERS) ×5 IMPLANT
CHLORAPREP W/TINT 26ML (MISCELLANEOUS) ×5 IMPLANT
CUTTER FLEX LINEAR 45M (STAPLE) ×5 IMPLANT
DERMABOND ADVANCED (GAUZE/BANDAGES/DRESSINGS) ×2
DERMABOND ADVANCED .7 DNX12 (GAUZE/BANDAGES/DRESSINGS) ×3 IMPLANT
GLOVE BIO SURGEON STRL SZ8 (GLOVE) ×15 IMPLANT
GLOVE INDICATOR 8.0 STRL GRN (GLOVE) ×5 IMPLANT
GOWN STRL REUS W/ TWL LRG LVL3 (GOWN DISPOSABLE) ×3 IMPLANT
GOWN STRL REUS W/ TWL XL LVL3 (GOWN DISPOSABLE) ×3 IMPLANT
GOWN STRL REUS W/TWL LRG LVL3 (GOWN DISPOSABLE) ×2
GOWN STRL REUS W/TWL XL LVL3 (GOWN DISPOSABLE) ×2
GRASPER SUT TROCAR 14GX15 (MISCELLANEOUS) ×5 IMPLANT
IRRIGATION STRYKERFLOW (MISCELLANEOUS) ×3 IMPLANT
IRRIGATOR STRYKERFLOW (MISCELLANEOUS) ×5
IV LACTATED RINGERS 1000ML (IV SOLUTION) ×10 IMPLANT
KIT PINK PAD W/HEAD ARE REST (MISCELLANEOUS) ×5
KIT PINK PAD W/HEAD ARM REST (MISCELLANEOUS) ×3 IMPLANT
KIT TURNOVER CYSTO (KITS) ×5 IMPLANT
LABEL OR SOLS (LABEL) ×5 IMPLANT
NS IRRIG 1000ML POUR BTL (IV SOLUTION) ×5 IMPLANT
NS IRRIG 500ML POUR BTL (IV SOLUTION) ×5 IMPLANT
PACK DNC HYST (MISCELLANEOUS) IMPLANT
PACK GYN LAPAROSCOPIC (MISCELLANEOUS) ×5 IMPLANT
PAD OB MATERNITY 4.3X12.25 (PERSONAL CARE ITEMS) ×5 IMPLANT
PAD PREP 24X41 OB/GYN DISP (PERSONAL CARE ITEMS) ×5 IMPLANT
POUCH ENDO CATCH 10MM SPEC (MISCELLANEOUS) IMPLANT
RELOAD STAPLE TA45 3.5 REG BLU (ENDOMECHANICALS) ×5 IMPLANT
SCISSORS METZENBAUM CVD 33 (INSTRUMENTS) IMPLANT
SHEARS HARMONIC ACE PLUS 36CM (ENDOMECHANICALS) ×5 IMPLANT
SLEEVE ENDOPATH XCEL 5M (ENDOMECHANICALS) ×10 IMPLANT
SUT PLAIN 4 0 FS 2 27 (SUTURE) ×5 IMPLANT
SUT VIC AB 0 CT2 27 (SUTURE) ×5 IMPLANT
SUT VIC AB 0 UR5 27 (SUTURE) ×5 IMPLANT
SUT VICRYL 0 AB UR-6 (SUTURE) ×5 IMPLANT
TROCAR ENDO BLADELESS 11MM (ENDOMECHANICALS) ×5 IMPLANT
TROCAR XCEL 12X100 BLDLESS (ENDOMECHANICALS) ×5 IMPLANT
TROCAR XCEL NON-BLD 5MMX100MML (ENDOMECHANICALS) ×5 IMPLANT
TUBING INSUFFLATION (TUBING) ×5 IMPLANT

## 2017-10-05 NOTE — Pre-Procedure Instructions (Signed)
Continued post op pain, called Dr Pernell Dupre.  Pain medication given.

## 2017-10-05 NOTE — Anesthesia Preprocedure Evaluation (Signed)
Anesthesia Evaluation  Patient identified by MRN, date of birth, ID band Patient awake    Reviewed: Allergy & Precautions, NPO status , Patient's Chart, lab work & pertinent test results, reviewed documented beta blocker date and time   Airway Mallampati: III  TM Distance: >3 FB     Dental  (+) Chipped   Pulmonary           Cardiovascular      Neuro/Psych    GI/Hepatic   Endo/Other  Morbid obesity  Renal/GU      Musculoskeletal   Abdominal   Peds  Hematology   Anesthesia Other Findings   Reproductive/Obstetrics                             Anesthesia Physical Anesthesia Plan  ASA: III  Anesthesia Plan: General   Post-op Pain Management:    Induction: Intravenous  PONV Risk Score and Plan:   Airway Management Planned: Oral ETT  Additional Equipment:   Intra-op Plan:   Post-operative Plan:   Informed Consent: I have reviewed the patients History and Physical, chart, labs and discussed the procedure including the risks, benefits and alternatives for the proposed anesthesia with the patient or authorized representative who has indicated his/her understanding and acceptance.       Plan Discussed with: CRNA  Anesthesia Plan Comments:         Anesthesia Quick Evaluation  

## 2017-10-05 NOTE — Op Note (Signed)
laparascopic appendectomy   Pamela Warren Date of operation:  10/05/2017  Indications: intraoperative consultation requested by Dr. Greggory Keen due to adhesion and appendix attached a right ovarian mass.  Surgeon: Sterling Big, MD, FACS  Anesthesia: General with endotracheal tube  Findings: Appendix attached to adnexal mass, Significant adhesion from the appendix to the ovarian mass and to the abdominal wall.  Estimated Blood Loss: less 5 cc for my portion of the procedure         Specimens: appendix          Procedure Details  I was called into the OR for intraoperative consult secondary to significant adhesions and the appendix attached to the ovarian mass.  Patient was already anesthetized and laparoscopic instruments were reinserted as well as pneumoperitoneum.  I performed a diagnostic laparoscopy confirming that the appendix was fused to the adnexal mass.  It was very difficult to distinguish if there was a cystic component from a primary appendiceal pathology. I actually inserted a 5 mm trocar in the suprapubic area under direct visualization avoiding any injury to the bladder.  Also upgraded the left lower quadrant port to a 12 mm to allow the passage of the staple.   Using the harmonic scalpel we were able to lyse adhesions from the abdominal wall to the appendix and also from the ovarian max to the appendix.  It was clear that the tip of the appendix was diffuse with the ovarian mass and therefore I did not want to violate any potential planes.  Decision was made to perform an appendectomy and the mesoappendix was divided in the standard fashion with a harmonic scalpel.  After creating an adequate window we were able to divide the appendix using a standard endoscopic blue load to staple. Adequate hemostasis was obtained.  We left the tip of the appendix attached to the ovarian mass and Dr. Greggory Keen we will continue to perform the ovarian mass excision and remove the  specimen.  This concluded my portion of the procedure.     Sterling Big, MD FACS

## 2017-10-05 NOTE — Anesthesia Postprocedure Evaluation (Signed)
Anesthesia Post Note  Patient: Pamela Warren  Procedure(s) Performed: DILATATION AND CURETTAGE /HYSTEROSCOPY (N/A Uterus) APPENDECTOMY LAPAROSCOPIC (N/A Abdomen) LAPAROSCOPIC LYSIS OF ADHESIONS (N/A Abdomen) LAPAROSCOPIC SALPINGO OOPHORECTOMY (Right Abdomen) LAPAROSCOPIC OVARIAN CYSTECTOMY (Right Abdomen)  Patient location during evaluation: PACU Anesthesia Type: General Level of consciousness: awake and alert Pain management: pain level controlled Vital Signs Assessment: post-procedure vital signs reviewed and stable Respiratory status: spontaneous breathing, nonlabored ventilation, respiratory function stable and patient connected to nasal cannula oxygen Cardiovascular status: blood pressure returned to baseline and stable Postop Assessment: no apparent nausea or vomiting Anesthetic complications: no     Last Vitals:  Vitals:   10/05/17 1721 10/05/17 1802  BP: 113/63 133/67  Pulse: (!) 105 (!) 106  Resp: 16 16  Temp: (!) 36.2 C 36.6 C  SpO2: 98% 100%    Last Pain:  Vitals:   10/05/17 1802  TempSrc: Temporal  PainSc: 4                  Zigmond Trela S

## 2017-10-05 NOTE — Transfer of Care (Signed)
Immediate Anesthesia Transfer of Care Note  Patient: Pamela Warren  Procedure(s) Performed: DILATATION AND CURETTAGE /HYSTEROSCOPY (N/A Uterus) APPENDECTOMY LAPAROSCOPIC (N/A Abdomen) LAPAROSCOPIC LYSIS OF ADHESIONS (N/A Abdomen) LAPAROSCOPIC SALPINGO OOPHORECTOMY (Right Abdomen) LAPAROSCOPIC OVARIAN CYSTECTOMY (Right Abdomen)  Patient Location: PACU  Anesthesia Type:General  Level of Consciousness: awake  Airway & Oxygen Therapy: Patient Spontanous Breathing  Post-op Assessment: Report given to RN  Post vital signs: Reviewed  Last Vitals:  Vitals Value Taken Time  BP    Temp    Pulse 101 10/05/2017  3:56 PM  Resp    SpO2 100 % 10/05/2017  3:56 PM  Vitals shown include unvalidated device data.  Last Pain:  Vitals:   10/05/17 1235  TempSrc: Oral  PainSc:          Complications: No apparent anesthesia complications

## 2017-10-05 NOTE — Anesthesia Procedure Notes (Signed)
Procedure Name: Intubation Date/Time: 10/05/2017 1:29 PM Performed by: Deri Fuelling, CRNA Pre-anesthesia Checklist: Patient identified, Emergency Drugs available, Suction available, Patient being monitored and Timeout performed Patient Re-evaluated:Patient Re-evaluated prior to induction Oxygen Delivery Method: Circle system utilized Preoxygenation: Pre-oxygenation with 100% oxygen Induction Type: IV induction Ventilation: Mask ventilation without difficulty Laryngoscope Size: McGraph and 3 Grade View: Grade II Tube type: Oral

## 2017-10-05 NOTE — Op Note (Signed)
OPERATIVE NOTE:  Pamela Warren PROCEDURE DATE: 10/05/2017   PREOPERATIVE DIAGNOSIS: 1.  Complex atypical endometrial hyperplasia 2.  Menorrhagia 3.  9 cm adnexal cyst, right  POSTOPERATIVE DIAGNOSIS: 1.  Complex atypical endometrial hyperplasia 2.  Menorrhagia 3.  Right adnexal mass  PROCEDURE: 1.  Hysteroscopy/D&C 2.  Laparoscopy with adhesio lysis and RSO 3.  Appendectomy (Dr. Elenor Legato)  SURGEON:  Herold Harms, MD ASSISTANTS: Dr. Elenor Legato and Dr. Valentino Saxon ANESTHESIA: General INDICATIONS: 27 y.o. G0P0000 with history of menorrhagia and atypical complex endometrial hyperplasia on endometrial biopsy, and history of right adnexal 9 cm simple cyst, presents for surgical management.  FINDINGS: Pelvic adhesive disease involving the omentum to the anterior abdominal wall, right adnexa, and cul-de-sac, lysed.  9 cm right adnexal mass involving the ovary, without excrescences or peritoneal studding.; normal ovarian tissue not identified; fallopian tube not identified; the appendix was densely adherent to the adnexal mass, requiring appendectomy (see Dr. Trixie Deis note for details of appendectomy).  Uterus was normal.  Left tube and ovary were normal.  Upper abdomen and gallbladder were normal.  I/O's: Total I/O In: 1340 [P.O.:240; I.V.:1100] Out: 200 [Urine:150; Blood:50] COUNTS:  YES SPECIMENS: Right adnexal mass (right ovary, fallopian tube, and appendix); brown turbid cyst fluid sent for cytology. ANTIBIOTIC PROPHYLAXIS:Ancef 2 grams (given after procedure completion) COMPLICATIONS: None immediate  PROCEDURE IN DETAIL: Patient was brought to the operating room placed in supine position.  General endotracheal anesthesia was induced without difficulty.  She was placed in the dorsolithotomy position using the bumblebee stirrups.  A ChloraPrep and Betadine abdominal perineal and intravaginal prep and drape was performed in standard fashion. Timeout was completed. Red Robinson  catheter was used to drain the 150 mL of urine from the bladder. 30 speculum was placed in the vagina.  Single-tooth tenaculum was placed on the anterior lip of the cervix.  Uterus was sounded to 9 cm.  The endocervical canal was already dilated and accepted a 20 French caliber dilator.  TheMyosure hysteroscope using lactated Ringer's as irrigant was then inserted into the uterine cavity with subsequent visualization of polypoid tissue.  The hysteroscope was removed and smooth and serrated curettes were then utilized to curettage the endometrial cavity with production of abundant tissue.  Repeat hysteroscopy demonstrated an excellent endometrial sampling.  Tissue was sent to pathology for evaluation.  Upon completion of the D&C, a Hulka tenaculum was placed on the cervix to facilitate uterine manipulation during the laparoscopy. The patient was repositioned.  Laparoscopy was then performed in standard fashion.  Subumbilical vertical incision 5 mm in length was made.  The Optiview laparoscopic trocar system was used to place a 5 mm port directly into the abdominal cavity without evidence of bowel or vascular injury.  Eventually 3 additional ports were placed into the abdomen under direct visualization; two 5 mm ports were placed in the right lower quadrant and suprapubic region, and a 12 mm port was placed into the left lower quadrant region.  Adhesio lysis of the anterior abdominal wall/omental adhesions and right adnexal adhesions was then performed to help restore normal anatomy.  Once adhesiolysis was completed, which took approximately 15 minutes, the right adnexal mass was isolated and noted to be attached inferiorly to the appendix.  The dense adhesions could not be adequately lysed safely and intraoperative consultation with general surgery was made in order to help assist further adhesiolysis and possibly perform appendectomy.  Dr. Elenor Legato from general surgery assisted and eventually performed the  appendectomy.  See Dr.  Pavone's note for further details of his appendectomy. The tibial pelvic ligament was isolated and transected using the Ace harmonic scalpel.  The utero-ovarian ligament was likewise clamped coagulated and cut using the Ace harmonic scalpel.  All residual mesosalpinx attachments were likewise taken down with the Ace harmonic scalpel separating the mass from the pelvic sidewall.  Visualization of the right ureter was made and there was normal peristalsis. The right adnexal mass then was taken out with the aid of a 15 mm bag removal system.  The left lower quadrant port site was dilated to facilitate insertion of the retrieval system.  It was opened and the adnexal specimen was placed into the bag.  As the bag was delivered to the surface of the abdomen, the cystic mass was then drained of 250 mL of brown serous fluid, which decompressed this cystic mass and facilitated removal of the specimen. The 12 mm port was placed back into the abdominal pelvic cavity.  Further inspection of the excision sites was completed along with irrigation of the pelvis.  The irrigant fluid was aspirated.  Good hemostasis was noted. The incisions were then closed; the 12 mm port site was closed using the Carter-Thomason suture ligation instrument.  2-0 Vicryl sutures were placed with excellent closure of the incision.  The other 5 mm ports were closed with 4-0 Vicryl suture, followed by application of Dermabond glue.  Pneumoperitoneum was released.  The patient was then awakened mobilized and taken recovery in satisfactory condition.  Procedure was well-tolerated.  No complications were encountered.  Herold Harms, MD   Prentice Docker. Beatris Si, MD, [facility]

## 2017-10-05 NOTE — Anesthesia Post-op Follow-up Note (Signed)
Anesthesia QCDR form completed.        

## 2017-10-05 NOTE — Interval H&P Note (Signed)
History and Physical Interval Note:  10/05/2017 12:36 PM  Pamela Warren  has presented today for surgery, with the diagnosis of COMPLEX ENDOMETRIAL HYPERPLASIA WITH ATYPIA, MORBID OBESITY WITH BMI 40.0-44.9, CYST OF OVARY, PELVIC PAIN  The various methods of treatment have been discussed with the patient and family. After consideration of risks, benefits and other options for treatment, the patient has consented to  Procedure(s): DILATATION AND CURETTAGE /HYSTEROSCOPY (N/A) LAPAROSCOPIC RIGHT OVARIAN CYSTECTOMY (Right) as a surgical intervention .  The patient's history has been reviewed, patient examined, no change in status, stable for surgery.  I have reviewed the patient's chart and labs.  Questions were answered to the patient's satisfaction.     Daphine Deutscher A Jeffren Dombek

## 2017-10-05 NOTE — Discharge Instructions (Signed)

## 2017-10-06 ENCOUNTER — Encounter: Payer: Self-pay | Admitting: Obstetrics and Gynecology

## 2017-10-06 LAB — RPR: RPR: NONREACTIVE

## 2017-10-08 NOTE — Addendum Note (Signed)
Addendum  created 10/08/17 1018 by Stormy Fabian, CRNA   Charge Capture section accepted

## 2017-10-09 LAB — CYTOLOGY - NON PAP

## 2017-10-10 LAB — SURGICAL PATHOLOGY

## 2017-10-11 ENCOUNTER — Ambulatory Visit (INDEPENDENT_AMBULATORY_CARE_PROVIDER_SITE_OTHER): Payer: BC Managed Care – PPO | Admitting: Obstetrics and Gynecology

## 2017-10-11 ENCOUNTER — Encounter: Payer: Self-pay | Admitting: Obstetrics and Gynecology

## 2017-10-11 VITALS — BP 113/80 | HR 92 | Ht 63.0 in | Wt 240.2 lb

## 2017-10-11 DIAGNOSIS — Z9889 Other specified postprocedural states: Secondary | ICD-10-CM

## 2017-10-11 DIAGNOSIS — N8502 Endometrial intraepithelial neoplasia [EIN]: Secondary | ICD-10-CM

## 2017-10-11 MED ORDER — MEGESTROL ACETATE 40 MG PO TABS: 40.0000 mg | ORAL_TABLET | Freq: Two times a day (BID) | ORAL | 5 refills | Status: DC

## 2017-10-11 NOTE — Progress Notes (Signed)
Chief complaint: 1.  Postop check 2.  Status post hysteroscopy with D&C for atypical complex endometrial hyperplasia 3.  Status post laparoscopic RSO and appendectomy and adhesio lysis for 9 cm adnexal cyst  PATHOLOGY:  Surgical Pathology  CASE: [id number]  PATIENT: Pamela Warren  Surgical Pathology Report   SPECIMEN SUBMITTED:  A. Endometrial curettings  B. Adnexal mass, right, including right tube, ovary and appendix   CLINICAL HISTORY:  None provided   PRE-OPERATIVE DIAGNOSIS:  Complex endometrial hyperplasia with atypia, morbid obesity with BMI  40.0-44.9, cyst of ovary, pelvic pain   POST-OPERATIVE DIAGNOSIS:  Same as pre-op   DIAGNOSIS:  A. 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  - DECIDUALIZED STROMA AND EPITHELIAL METAPLASTIC CHANGES CONSISTENT WITH  PROGESTIN EFFECT.  - ACUTE INFLAMMATION AND BREAKDOWN.   B. ADENEXAL MASS, RIGHT AND APPENDIX; RIGHT SALPINGOOPHORECTOMY AND  APPENDECTOMY:  - CYSTIC DILATION OF FALLOPIAN TUBE.  - TUBO-OVARIAN-APPENDICEAL ADHESIONS.  - FOLLICULAR OVARIAN CYST WITH BACKGROUND CYSTIC FOLLICLES.  - NEGATIVE FOR ATYPIA AND MALIGNANCY.      Patient reports no significant abdominal pelvic pain at this time.  She previously experienced some heavy bleeding several days after surgery but this has resolved. Patient reports normal bowel function. Patient reports normal bladder function.  OBJECTIVE: BP 113/80   Pulse 92   Ht  (1.6 m)   Wt 240 lb 3.2 oz (109 kg)   LMP 10/01/2017 (Exact Date)   BMI 42.55 kg/m  Pleasant well-appearing female no acute distress.  Alert and oriented.  Affect is appropriate. Abdomen: Soft, nontender; laparoscopy incision sites are healing without evidence of infection, induration, or hernia. Extremities: Warm and dry without edema  ASSESSMENT: 1.   Normal 1 week postop check 2.  Status post hysteroscopy/D&C For complex atypical endometrial hyperplasia; pathology confirms diagnosis 3.  Status post laparoscopy with adhesio lysis, RSO and appendectomy for 9 cm right adnexal mass, simple.  Benign pathology 4.  History of PCOD; morbid obesity 5.  Desires conception  PLAN: 1.  Resume activities as tolerated with the exception of intercourse and swimming for the next 10 days 2.  Begin Megace 40 mg twice a day for 3 months 3.  Use back-up contraception 4.  Return in 4 weeks for follow-up 5.  Consultation with GYN oncology to discuss the implications of EIN in a young woman desiring fertility with recommendations for pregnancy and follow-up.  Patient understands that there still is a possibility she may have as high as a 20% chance of having an endometrial cancer despite the [facility] pathology.  Herold Harms, MD  Note: This dictation was prepared with Dragon dictation along with smaller phrase technology. Any transcriptional errors that result from this process are unintentional.

## 2017-10-11 NOTE — Patient Instructions (Addendum)
1.  Resume activities as tolerated.  Avoid intercourse and bathing/swimming for another 10 days 2.  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  Return in 4 weeks for follow-up here 4.  GYN oncology consultation is scheduled.  You will be contacted regarding appointment time.  Megestrol tablets What is this medicine? MEGESTROL (me JES trol) belongs to a class of drugs known as progestins. Megestrol tablets are used to treat advanced breast or endometrial cancer. This medicine may be used for other purposes; ask your health care provider or pharmacist if you have questions. COMMON BRAND NAME(S): Megace What should I tell my health care provider before I take this medicine? They need to know if you have any of these conditions: -adrenal gland problems -history of blood clots of the legs, lungs, or other parts of the body -diabetes -kidney disease -liver disease -stroke -an unusual or allergic reaction to megestrol, other medicines, foods, dyes, or preservatives -pregnant or trying to get pregnant -breast-feeding How should I use this medicine? Take this medicine by mouth. Follow the directions on the prescription label. Do not take your medicine more often than directed. Take your doses at regular intervals. Do not stop taking except on the advice of your doctor or health care professional. Talk to your pediatrician regarding the use of this medicine in children. Special care may be needed. Overdosage: If you think you have taken too much of this medicine contact a poison control center or emergency room at once. NOTE: This medicine is only for you. Do not share this medicine with others. What if I miss a dose? If you miss a dose, take it as soon as you can. If it is almost time for your next dose, take only that dose. Do not take double or extra doses. What may interact with this medicine? Do not take this medicine with any of the following  medications: -dofetilide This medicine may also interact with the following medications: -carbamazepine -indinavir -phenobarbital -phenytoin -primidone -rifampin -warfarin This list may not describe all possible interactions. Give your health care provider a list of all the medicines, herbs, non-prescription drugs, or dietary supplements you use. Also tell them if you smoke, drink alcohol, or use illegal drugs. Some items may interact with your medicine. What should I watch for while using this medicine? Visit your doctor or health care professional for regular checks on your progress. Continue taking this medicine even if you feel better. It may take 2 months of regular use before you know if this medicine is working for your condition. If you are a female of child-bearing age, use an effective method of birth control while you are taking this medicine. This medicine should not be used by females who are pregnant or breast-feeding. There is a potential for serious side effects to an unborn child or to an infant. Talk to your health care professional or pharmacist for more information. If you have diabetes, this medicine may affect blood sugar levels. Check your blood sugar and talk to your doctor or health care professional if you notice changes. What side effects may I notice from receiving this medicine? Side effects that you should report to your doctor or health care professional as soon as possible: -difficulty breathing or shortness of breath -chest pain -dizziness -fluid retention -increased blood pressure -leg pain or swelling -nausea and vomiting -skin rash or itching -weakness Side effects that usually do not require medical attention (report to your  doctor or health care professional if they continue or are bothersome): -breakthrough menstrual bleeding -hot flashes or flushing -increased appetite -mood changes -sweating -weight gain This list may not describe all possible  side effects. Call your doctor for medical advice about side effects. You may report side effects to FDA at 1-800-FDA-1088. Where should I keep my medicine? Keep out of the reach of children. Store at controlled room temperature between 15 and 30 degrees C (59 and 86 degrees F). Protect from heat above 40 degrees C (104 degrees F). Throw away any unused medicine after the expiration date. NOTE: This sheet is a summary. It may not cover all possible information. If you have questions about this medicine, talk to your doctor, pharmacist, or health care provider.  2018 Elsevier/[employer] Standard (2007-11-25 15:57:10)

## 2017-10-17 ENCOUNTER — Encounter: Payer: Self-pay | Admitting: Obstetrics and Gynecology

## 2017-10-23 ENCOUNTER — Encounter: Payer: BC Managed Care – PPO | Admitting: Obstetrics and Gynecology

## 2017-10-24 ENCOUNTER — Inpatient Hospital Stay: Payer: BC Managed Care – PPO | Attending: Obstetrics and Gynecology | Admitting: Obstetrics and Gynecology

## 2017-10-24 ENCOUNTER — Encounter: Payer: Self-pay | Admitting: Obstetrics and Gynecology

## 2017-10-24 VITALS — BP 109/75 | HR 101 | Temp 97.5°F | Resp 18 | Ht 63.6 in | Wt 240.7 lb

## 2017-10-24 DIAGNOSIS — Z79818 Long term (current) use of other agents affecting estrogen receptors and estrogen levels: Secondary | ICD-10-CM | POA: Diagnosis not present

## 2017-10-24 DIAGNOSIS — N8502 Endometrial intraepithelial neoplasia [EIN]: Secondary | ICD-10-CM

## 2017-10-24 DIAGNOSIS — N83201 Unspecified ovarian cyst, right side: Secondary | ICD-10-CM

## 2017-10-24 NOTE — Progress Notes (Signed)
Pt feels good, recuperated from surgery and no pain. On megace.

## 2017-10-24 NOTE — Patient Instructions (Addendum)
Endometrial Hyperplasia Endometrial hyperplasia is abnormal thickening of the tissue that lines the inside of the uterus (endometrium). This condition can also cause cell changes (atypia) that can lead to endometrial cancer. There are four types of endometrial hyperplasia:  Endometrial thickening only (simple hyperplasia).  Endometrial thickening and crowding of cells (complex hyperplasia).  Endometrial thickening and cell changes (simple atypical hyperplasia). This type has a low risk of becoming cancerous.  Endometrial thickening, cell crowding, and cell changes (complex atypical hyperplasia). This type has a higher risk of becoming cancerous.  Early diagnosis and treatment are important to reduce the risk of cancer. What are the causes? This condition is caused by an imbalance of the reproductive hormones estrogen and progesterone. At the beginning of your menstrual cycle, your ovaries make estrogen. This thickens the lining of your uterus to prepare for pregnancy. If pregnancy does not occur, your estrogen level drops. Then, the hormone progesterone prompts your body to shed the lining of your uterus. This is your menstrual period. Endometrial hyperplasia results from too much estrogen and not enough progesterone. What increases the risk? The following factors may make you more likely to develop this condition:  Being older than 26 years of age. The condition is most common just before or after menopause. Menopause means 12 months without a menstrual period.  Taking an estrogen medicine or a medicine that acts like estrogen.  Having polycystic ovary syndrome (PCOS).  Being overweight.  Smoking.  Having diabetes.  Having irregular periods.  Having started periods early.  Having started menopause late.  Never having been pregnant.  Having a family history of uterine, ovarian, or colon cancer.  Having other diseases such as gallbladder or thyroid disease.  What are the  signs or symptoms? The main symptom of this condition is abnormal vaginal bleeding. The bleeding may:  Be heavier and longer during menstrual periods.  Happen more often than a normal menstrual cycle.  Occur after menopause.  How is this diagnosed? This condition may be diagnosed based on:  Your symptoms and medical history, including risk factors.  A physical exam.  Tests, such as: ? An imaging study done with sound waves (transvaginal ultrasound) to check the endometrium. This test uses a sound wave probe that is inserted into your vagina. ? A procedure to take a sample of endometrial tissue through the vagina so it can be looked at under a microscope (endometrial biopsy). ? A procedure in which tissue is scraped or suctioned from the inside of the uterus (dilation and curettage). ? A procedure in which a thin, lighted device is inserted into the uterus through the cervix to see inside the uterus (hysteroscopy).  How is this treated? Treatment for this condition depends on the type of hyperplasia that you have. Synthetic progesterone (progestin) is the most common treatment. Progestin can be given as a pill, injection, or vaginal cream, or as a device that is implanted in your uterus (intrauterine device, IUD). If you have hyperplasia with atypia, you may need surgery to remove your uterus (hysterectomy). You may be more likely to have this procedure if:  You have complex atypical hyperplasia.  You are past menopause.  You do not want to become pregnant.  Follow these instructions at home:  Take over-the-counter and prescription medicines only as told by your health care provider.  Do not use any products that contain nicotine or tobacco, such as cigarettes and e-cigarettes. If you need help quitting, ask your health care provider.  Lose weight if  you are overweight. Ask your health care provider to recommend a diet and exercise program to help you reach and maintain a healthy  weight.  Keep all follow-up visits as told by your health care provider. This is important. Contact a health care provider if:  You have periods that are heavier or last longer than usual.  You have your period more often than usual.  You have vaginal bleeding after menopause. Get help right away if:  You have vaginal bleeding that is so heavy that you need to change your tampon or sanitary napkin after less than 2 hours.  You pass blood clots that are larger than the size of a quarter. Summary  Endometrial hyperplasia is abnormal thickening of the tissue that lines the inside of the uterus (endometrium).  The main symptom of this condition is abnormal vaginal bleeding. This may be heavier, longer, or more frequent periods, or it may be vaginal bleeding after menopause.  Some types of hyperplasia also cause cell changes (atypia) that can lead to cancer.  Always let your health care provider know about abnormal vaginal bleeding.  Early diagnosis and treatment are important to reduce the risk of cancer. This information is not intended to replace advice given to you by your health care provider. Make sure you discuss any questions you have with your health care provider. Document Released: 02/18/2016 Document Revised: 02/18/2016 Document Reviewed: 02/18/2016 Elsevier Interactive Patient Education  2018 [employer].  Megestrol tablets What is this medicine? MEGESTROL (me JES trol) belongs to a class of drugs known as progestins. Megestrol tablets are used to treat advanced breast or endometrial cancer. This medicine may be used for other purposes; ask your health care provider or pharmacist if you have questions. COMMON BRAND NAME(S): Megace What should I tell my health care provider before I take this medicine? They need to know if you have any of these conditions: -adrenal gland problems -history of blood clots of the legs, lungs, or other parts of the body -diabetes -kidney  disease -liver disease -stroke -an unusual or allergic reaction to megestrol, other medicines, foods, dyes, or preservatives -pregnant or trying to get pregnant -breast-feeding How should I use this medicine? Take this medicine by mouth. Follow the directions on the prescription label. Do not take your medicine more often than directed. Take your doses at regular intervals. Do not stop taking except on the advice of your doctor or health care professional. Talk to your pediatrician regarding the use of this medicine in children. Special care may be needed. Overdosage: If you think you have taken too much of this medicine contact a poison control center or emergency room at once. NOTE: This medicine is only for you. Do not share this medicine with others. What if I miss a dose? If you miss a dose, take it as soon as you can. If it is almost time for your next dose, take only that dose. Do not take double or extra doses. What may interact with this medicine? Do not take this medicine with any of the following medications: -dofetilide This medicine may also interact with the following medications: -carbamazepine -indinavir -phenobarbital -phenytoin -primidone -rifampin -warfarin This list may not describe all possible interactions. Give your health care provider a list of all the medicines, herbs, non-prescription drugs, or dietary supplements you use. Also tell them if you smoke, drink alcohol, or use illegal drugs. Some items may interact with your medicine. What should I watch for while using this medicine? Visit  your doctor or health care professional for regular checks on your progress. Continue taking this medicine even if you feel better. It may take 2 months of regular use before you know if this medicine is working for your condition. If you are a female of child-bearing age, use an effective method of birth control while you are taking this medicine. This medicine should not be used  by females who are pregnant or breast-feeding. There is a potential for serious side effects to an unborn child or to an infant. Talk to your health care professional or pharmacist for more information. If you have diabetes, this medicine may affect blood sugar levels. Check your blood sugar and talk to your doctor or health care professional if you notice changes. What side effects may I notice from receiving this medicine? Side effects that you should report to your doctor or health care professional as soon as possible: -difficulty breathing or shortness of breath -chest pain -dizziness -fluid retention -increased blood pressure -leg pain or swelling -nausea and vomiting -skin rash or itching -weakness Side effects that usually do not require medical attention (report to your doctor or health care professional if they continue or are bothersome): -breakthrough menstrual bleeding -hot flashes or flushing -increased appetite -mood changes -sweating -weight gain This list may not describe all possible side effects. Call your doctor for medical advice about side effects. You may report side effects to FDA at 1-800-FDA-1088. Where should I keep my medicine? Keep out of the reach of children. Store at controlled room temperature between 15 and 30 degrees C (59 and 86 degrees F). Protect from heat above 40 degrees C (104 degrees F). Throw away any unused medicine after the expiration date. NOTE: This sheet is a summary. It may not cover all possible information. If you have questions about this medicine, talk to your doctor, pharmacist, or health care provider.  2018 Elsevier/Gold Standard (2007-11-25 15:57:10)

## 2017-10-24 NOTE — Progress Notes (Signed)
Gynecologic Oncology Consult Visit   Referring Provider: Dr. 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 G0P0 female with h/o PCO who is seen in consultation from Dr. Greggory Keen for EIN.   She was initially seen by Dr. Greggory Keen for history of menorrhagia and atypical complex endometrial hyperplasia on endometrial biopsy and history of right adnexal 9cm simple cyst.   10/01/17- [place] Complete W Transvaginal IMPRESSION: No focal uterine abnormality.  Large 9 cm cyst in the right ovary. Since these may be difficult to assess completely with [place], further evaluation of simple-appearing cysts >7 cm with MRI or surgical evaluation is recommended according to the Society of Radiologists in Ultrasound 2010 Consensus Conference Statement (D Lenis Noon et al. Management of Asymptomatic Ovarian and other Adnexal Cysts Imaged at [place]: Society of Radiologists in Ultrasound Consensus Conference Statement 2010. Radiology 256 (Sept 2010): 829-562.)  On 10/05/2017 she underwent hysteroscopy/D&C, laparoscopy with adhesion lysis and RSO with Dr. Greggory Keen. 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 9cm right adnexal mass involving the ovary without excrescences or peritoneal studding. Normal ovarian tissue was not identified. Fallopian tube was not identified. The appendix was densely adherent to the adnexal mass and required appendectomy by Dr. Elenor Legato. Uterus was normal. Left tube and ovary were normal. Upper abdomen and gallbladder were normal. Right adnexal mass, right ovary, right fallopian tube, and appendix, and brown turbid cyst fluid were sent for pathology/cytology.   PATHOLOGY:  A. 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  - DECIDUALIZED STROMA AND EPITHELIAL METAPLASTIC CHANGES CONSISTENT WITH  PROGESTIN EFFECT.  - ACUTE INFLAMMATION AND BREAKDOWN.   B. ADENEXAL MASS, RIGHT AND APPENDIX; RIGHT SALPINGOOPHORECTOMY AND  APPENDECTOMY:  - CYSTIC DILATION OF FALLOPIAN TUBE.  - TUBO-OVARIAN-APPENDICEAL ADHESIONS.  - FOLLICULAR OVARIAN CYST WITH BACKGROUND CYSTIC FOLLICLES.  - NEGATIVE FOR ATYPIA AND MALIGNANCY.   A. OVARIAN CYST FLUID; ASPIRATION:  - NEGATIVE FOR MALIGNANCY.  - PROTEINACEOUS MATERIAL AND HEMOSIDERIN LADEN MACROPHAGES.   She was started on Megace 40mg  BID for 3 months. She is tolerating therapy well other than hot flashes.  She is currently using condoms for contraception. She was previously on oral birth control pills.   She presents to [facility] clinic for discussion of implications of EIN in young female desiring fertility as well as recommendations for pregnancy and follow-up.    Problem List: Patient Active Problem List   Diagnosis Date Noted  . Status post laparoscopy with RSO/appendectomy/adhesiolysis 10/11/2017  . Status post D&C/hysteroscopy 10/11/2017  . Menorrhagia with irregular cycle 09/17/2017  . 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 Ovarian mass, right 09/17/2017  . Complex endometrial hyperplasia with atypia 09/17/2017  . Irregular periods/menstrual cycles 08/25/2015  . Morbid obesity with BMI of 40.0-44.9, adult (HCC) 08/25/2015  . Infertility, female 08/25/2015    Past Medical History: Past Medical History:  Diagnosis Date  . Complex endometrial hyperplasia    right  . Heavy menstrual bleeding   . Infertility, female   . Ovarian cyst   . Ovarian cyst   . PCOS (polycystic ovarian syndrome)     Past Surgical History: Past Surgical History:  Procedure Laterality Date  . DILATION AND CURETTAGE OF UTERUS     2015- cyst in tube -Dr Binnie Rail  . HYSTEROSCOPY W/D&C N/A 10/05/2017  Procedure: DILATATION AND CURETTAGE /HYSTEROSCOPY;  Surgeon:  Defrancesco, Prentice Docker, MD;  Location: [facility];  Service: Gynecology;  Laterality: N/A;  . LAPAROSCOPIC APPENDECTOMY N/A 10/05/2017   Procedure: APPENDECTOMY LAPAROSCOPIC;  Surgeon: Herold Harms, MD;  Location: [facility];  Service: Gynecology;  Laterality: N/A;  . LAPAROSCOPIC LYSIS OF ADHESIONS N/A 10/05/2017   Procedure: LAPAROSCOPIC LYSIS OF ADHESIONS;  Surgeon: Herold Harms, MD;  Location: [facility];  Service: Gynecology;  Laterality: N/A;  . LAPAROSCOPIC OVARIAN CYSTECTOMY Right 10/05/2017   Procedure: LAPAROSCOPIC OVARIAN CYSTECTOMY;  Surgeon: Herold Harms, MD;  Location: [facility];  Service: Gynecology;  Laterality: Right;  . LAPAROSCOPIC SALPINGO OOPHERECTOMY Right 10/05/2017   Procedure: LAPAROSCOPIC SALPINGO OOPHORECTOMY;  Surgeon: Defrancesco, Prentice Docker, MD;  Location: [facility];  Service: Gynecology;  Laterality: Right;    Past Gynecologic History:  Menarche:  Age 25 Irregular menstrual periods.  Denies history of STDs  OB History:  OB History  Gravida Para Term Preterm AB Living  0 0 0 0 0 0  SAB TAB Ectopic Multiple Live Births  0 0 0 0      Family History: Family History  Problem Relation Age of Onset  . Breast cancer Maternal Grandmother   . Diabetes Maternal Grandmother   . Lung cancer Paternal Grandmother   . Lupus Paternal Grandmother   . Diabetes Mother   . Colon cancer Other   . Cervical cancer Other   . Heart disease Neg Hx     Social History: Social History   Socioeconomic History  . Marital status: Married    Spouse name: Not on file  . Number of children: Not on file  . Years of education: Not on file  . Highest education level: Not on file  Occupational History  . Not on file  Social Needs  . Financial resource strain: Not on file  . Food insecurity:    Worry: Not on file    Inability: Not on file  . Transportation needs:    Medical: Not on file    Non-medical: Not on file  Tobacco Use  . Smoking status: Never Smoker   . Smokeless tobacco: Never Used  Substance and Sexual Activity  . Alcohol use: Yes    Comment: rare  . Drug use: No  . Sexual activity: Yes    Birth control/protection: None  Lifestyle  . Physical activity:    Days per week: Not on file    Minutes per session: Not on file  . Stress: Not on file  Relationships  . Social connections:    Talks on phone: Not on file    Gets together: Not on file    Attends religious service: Not on file    Active member of club or organization: Not on file    Attends meetings of clubs or organizations: Not on file    Relationship status: Not on file  . Intimate partner violence:    Fear of current or ex partner: Not on file    Emotionally abused: Not on file    Physically abused: Not on file    Forced sexual activity: Not on file  Other Topics Concern  . Not on file  Social History Narrative  . Not on file    Allergies: No Known Allergies  Current Medications: Current Outpatient Medications  Medication Sig Dispense Refill  . megestrol (MEGACE) 40 MG tablet Take 1 tablet (40 mg total) by mouth 2 (two) times daily. Can increase to two tablets  twice a day in the event of heavy bleeding 60 tablet 5  . ibuprofen (ADVIL,MOTRIN) 800 MG tablet Take 1 tablet (800 mg total) by mouth 3 (three) times daily. 30 tablet 0  . oxyCODONE-acetaminophen (PERCOCET) 5-325 MG tablet Take 1 tablet by mouth every 4 (four) hours as needed for severe pain. 30 tablet 0   No current facility-administered medications for this visit.     Review of Systems General: negative for fevers, chills, fatigue, changes in sleep, changes in weight or appetite Skin: negative for changes in color, texture, moles or lesions Eyes: negative for changes in vision, pain, diplopia HEENT: negative for change in hearing, pain, discharge, tinnitus, vertigo, voice changes, sore throat, neck masses Breasts: negative for breast lumps Pulmonary: negative for dyspnea, orthopnea, productive  cough Cardiac: negative for palpitations, syncope, pain, discomfort, pressure Gastrointestinal: negative for dysphagia, nausea, vomiting, jaundice, pain, constipation, diarrhea, hematemesis, hematochezia Genitourinary/Sexual: negative for dysuria, discharge, hesitancy, nocturia, retention, stones, infections, STD's, incontinence Ob/Gyn: negative for irregular bleeding, pain Musculoskeletal: negative for pain, stiffness, swelling, range of motion limitation Hematology: negative for easy bruising, bleeding Neurologic/Psych: negative for headaches, seizures, paralysis, weakness, tremor, change in gait, change in sensation, mood swings, depression, anxiety, change in memory   Objective:  Physical Examination:  BP 109/75 (BP Location: Right Arm)   Pulse (!) 101   Temp (!) 97.5 F (36.4 C) (Tympanic)   Resp 18   Ht 5' 3.6" (1.615 m)   Wt 240 lb 11.2 oz (109.2 kg)   LMP 10/01/2017 (Exact Date)   BMI 41.84 kg/m    ECOG Performance Status: 0 - Asymptomatic  GENERAL: Patient is a well appearing female in no acute distress HEENT:  Sclerae anicteric.  Oropharynx clear and moist. No ulcerations or evidence of oropharyngeal candidiasis. Neck is supple.  NODES:  No cervical, supraclavicular, or axillary lymphadenopathy palpated.  LUNGS:  Clear to auscultation bilaterally.  No wheezes or rhonchi. HEART:  Regular rate and rhythm. No murmur appreciated. ABDOMEN:  Soft, nontender.  Positive, normoactive bowel sounds. No organomegaly palpated. MSK:  No focal spinal tenderness to palpation. Full range of motion bilaterally in the upper extremities. EXTREMITIES:  No peripheral edema.   SKIN:  Clear with no obvious rashes or skin changes. No nail dyscrasia. NEURO:  Nonfocal. Well oriented.  Appropriate affect. BREAST: Breasts appear normal, no suspicious masses, no skin or nipple changes or axillary nodes  Pelvic: Exam Chaperoned by NP EGBUS: no lesions Cervix: no lesions, nontender, mobile Vagina:  no lesions, no discharge or bleeding Uterus: normal size, nontender, mobile Adnexa: no palpable masses Rectovaginal: confirmatory   Lab Review Labs on site today: n/a  Radiologic Imaging: n/a    Assessment:  Pamela Warren is a 27 y.o. 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 Strongly desires fertility. Mild side effects with Megace. History of PCO, question possible fertility issues.    Medical co-morbidities complicating care: Body mass index is 41.84 kg/m.  Plan:   Problem List Items Addressed This Visit    None    Visit Diagnoses    Endometrial hyperplasia with atypia    -  Primary      We discussed options for management and she is aware of surgical options. She is currently on Megace and strongly desires medical management given her desire for pregnancy. She does has a step-child with her husband.   EIN with atypia has a risk of progression to endometrial cancer of up to 28 percent and some have reported up to 40%  chance of underlying malignancy. With hormonal therapy regression rates of 49-90% have been reported with relapse rates of 20-26%. Successful pregnancy rates after regression of atypical EIN have been reported in 30 percent. There are not specific guidelines on how long to wait after regression of EIN after attempting conception.   We reviewed issues with Megace and side effects. Given her need for weight loss, Megace may be challenging due to increased medication induced appetite. She also needs contraception for the Mirena IUD may be ideal. She is interested in this approach and Dr. Greggory Keenefrancesco is willing to place the IUD and do her repeat biopsy in 6 months. I have recommended earlier biopsy if she has symptoms of increased or abnormal vaginal bleeding.   We also discussed her weight and need for weight loss, stressed good nutrition, and exercise. I asked her to discuss use of metformin with her primary physicians.  The patient's diagnosis, an outline of the  further diagnostic and laboratory studies which will be required, the recommendation, and alternatives were discussed.  All questions were answered to the patient's satisfaction.  A total of 90 minutes were spent with the patient/family today; 50% was spent in education, counseling and coordination of care.  Consuello MasseLauren Allen, DNP, AGNP-C [facility] [phone] (work cell) [phone] (office) 10/24/17 12:55 PM  I personally had a face to face interaction and evaluated the patient jointly with the NP, Ms. Consuello MasseLauren Allen.  I have reviewed her history and available records and have performed the key portions of the physical exam including abdominal exam, pelvic exam with my findings confirming those documented above by the APP.  I have discussed the case with the APP and the patient.  I agree with the above documentation, assessment and plan which was fully formulated by me.  Counseling was completed by me.   I personally saw the patient and performed a substantive portion of this encounter in conjunction with the listed APP as documented above.  Angeles Leta JunglingAlvarez Secord, MD   CC: Dr. Greggory Keenefrancesco

## 2017-11-08 ENCOUNTER — Ambulatory Visit (INDEPENDENT_AMBULATORY_CARE_PROVIDER_SITE_OTHER): Payer: BC Managed Care – PPO | Admitting: Obstetrics and Gynecology

## 2017-11-08 ENCOUNTER — Encounter: Payer: Self-pay | Admitting: Obstetrics and Gynecology

## 2017-11-08 VITALS — BP 119/79 | HR 103 | Ht 63.0 in | Wt 241.1 lb

## 2017-11-08 DIAGNOSIS — E282 Polycystic ovarian syndrome: Secondary | ICD-10-CM | POA: Diagnosis not present

## 2017-11-08 DIAGNOSIS — N8502 Endometrial intraepithelial neoplasia [EIN]: Secondary | ICD-10-CM | POA: Diagnosis not present

## 2017-11-08 DIAGNOSIS — Z6841 Body Mass Index (BMI) 40.0 and over, adult: Secondary | ICD-10-CM

## 2017-11-08 DIAGNOSIS — N921 Excessive and frequent menstruation with irregular cycle: Secondary | ICD-10-CM

## 2017-11-08 DIAGNOSIS — N979 Female infertility, unspecified: Secondary | ICD-10-CM

## 2017-11-08 LAB — POCT URINE PREGNANCY: Preg Test, Ur: NEGATIVE

## 2017-11-08 MED ORDER — METFORMIN HCL 500 MG PO TABS: ORAL_TABLET | ORAL | 3 refills | Status: DC

## 2017-11-08 NOTE — Patient Instructions (Signed)
1.  Mirena IUD is inserted today 2.  Discontinue Megace 3.  Begin metformin 500 mg daily 4.  Dietary consultation is placed to help with weight loss management 5.  Begin working with Systems analystpersonal trainer gradually

## 2017-11-08 NOTE — Progress Notes (Signed)
GYN ENCOUNTER NOTE  Subjective:       Pamela Warren is a 27 y.o. G0P0000 female is here for gynecologic evaluation of the following issues:  1.  Mirena IUD insertion. 2.  Obesity management-preconception 3.  Complex endometrial hyperplasia with atypia  Pamela Warren presents today for Mirena IUD insertion.  She has been on Megace therapy for management of atypical endometrial hyperplasia, complex.  Following GYN oncology consultation, recommendation was for Mirena IUD insertion, followed by discontinuation of Megace in order to help minimize the impact of weight gain side effects from megestrol.  Since starting her Megace she has had minimal spotting.  Patient is very interested in modifying her weight prior to conception.  She is starting with a personal trainer this week.  She has been advised to start a gradual exercise regimen program and gradually increase activities appropriately.  She has been on a keto diet without success.  She is willing to proceed with nutritional consultation for optimizing diet with weight reduction benefits. Because of insulin resistance associated with PCO, she is willing to go on metformin.   Gynecologic History Patient's last menstrual period was 11/05/2017 (exact date).  Obstetric History OB History  Gravida Para Term Preterm AB Living  0 0 0 0 0 0  SAB TAB Ectopic Multiple Live Births  0 0 0 0      Past Medical History:  Diagnosis Date  . Complex endometrial hyperplasia    right  . Heavy menstrual bleeding   . Infertility, female   . Ovarian cyst   . Ovarian cyst   . PCOS (polycystic ovarian syndrome)     Past Surgical History:  Procedure Laterality Date  . DILATION AND CURETTAGE OF UTERUS     2015- cyst in tube -Dr Binnie Rail  . HYSTEROSCOPY W/D&C N/A 10/05/2017   Procedure: DILATATION AND CURETTAGE /HYSTEROSCOPY;  Surgeon: Kamika Goodloe, Prentice Docker, MD;  Location: [facility];  Service: Gynecology;  Laterality: N/A;  . LAPAROSCOPIC APPENDECTOMY  N/A 10/05/2017   Procedure: APPENDECTOMY LAPAROSCOPIC;  Surgeon: Herold Harms, MD;  Location: [facility];  Service: Gynecology;  Laterality: N/A;  . LAPAROSCOPIC LYSIS OF ADHESIONS N/A 10/05/2017   Procedure: LAPAROSCOPIC LYSIS OF ADHESIONS;  Surgeon: Herold Harms, MD;  Location: [facility];  Service: Gynecology;  Laterality: N/A;  . LAPAROSCOPIC OVARIAN CYSTECTOMY Right 10/05/2017   Procedure: LAPAROSCOPIC OVARIAN CYSTECTOMY;  Surgeon: Herold Harms, MD;  Location: [facility];  Service: Gynecology;  Laterality: Right;  . LAPAROSCOPIC SALPINGO OOPHERECTOMY Right 10/05/2017   Procedure: LAPAROSCOPIC SALPINGO OOPHORECTOMY;  Surgeon: Jaliya Siegmann, Prentice Docker, MD;  Location: [facility];  Service: Gynecology;  Laterality: Right;    Current Outpatient Medications on File Prior to Visit  Medication Sig Dispense Refill  . ibuprofen (ADVIL,MOTRIN) 800 MG tablet Take 1 tablet (800 mg total) by mouth 3 (three) times daily. 30 tablet 0  . megestrol (MEGACE) 40 MG tablet Take 1 tablet (40 mg total) by mouth 2 (two) times daily. Can increase to two tablets twice a day in the event of heavy bleeding 60 tablet 5   No current facility-administered medications on file prior to visit.     No Known Allergies  Social History   Socioeconomic History  . Marital status: Married    Spouse name: Not on file  . Number of children: Not on file  . Years of education: Not on file  . Highest education level: Not on file  Occupational History  . Not on file  Social Needs  .  Financial resource strain: Not on file  . Food insecurity:    Worry: Not on file    Inability: Not on file  . Transportation needs:    Medical: Not on file    Non-medical: Not on file  Tobacco Use  . Smoking status: Never Smoker  . Smokeless tobacco: Never Used  Substance and Sexual Activity  . Alcohol use: Yes    Comment: rare  . Drug use: No  . Sexual activity: Yes    Birth control/protection: None  Lifestyle  .  Physical activity:    Days per week: Not on file    Minutes per session: Not on file  . Stress: Not on file  Relationships  . Social connections:    Talks on phone: Not on file    Gets together: Not on file    Attends religious service: Not on file    Active member of club or organization: Not on file    Attends meetings of clubs or organizations: Not on file    Relationship status: Not on file  . Intimate partner violence:    Fear of current or ex partner: Not on file    Emotionally abused: Not on file    Physically abused: Not on file    Forced sexual activity: Not on file  Other Topics Concern  . Not on file  Social History Narrative  . Not on file    Family History  Problem Relation Age of Onset  . Breast cancer Maternal Grandmother   . Diabetes Maternal Grandmother   . Lung cancer Paternal Grandmother   . Lupus Paternal Grandmother   . Diabetes Mother   . Colon cancer Other   . Cervical cancer Other   . Heart disease Neg Hx     The following portions of the patient's history were reviewed and updated as appropriate: allergies, current medications, past family history, past medical history, past social history, past surgical history and problem list.  Review of Systems Review of Systems - Review of Systems  Constitutional: Negative.   Respiratory: Negative.   Cardiovascular: Negative.   Gastrointestinal: Negative.   Genitourinary: Negative.        Vaginal spotting  Musculoskeletal: Negative.   Skin: Negative.   Neurological: Negative.   Psychiatric/Behavioral: Negative.     Objective:   BP 119/79   Pulse (!) 103   Ht 5\' 3"  (1.6 m)   Wt 241 lb 1.6 oz (109.4 kg)   LMP 11/05/2017 (Exact Date) Comment: spotting only  BMI 42.71 kg/m  CONSTITUTIONAL: Well-developed, well-nourished female in no acute distress.  HENT:  Normocephalic, atraumatic.  NECK: Not examined SKIN: Skin is warm and dry. No rash noted. Not diaphoretic. No erythema. No  pallor. NEUROLGIC: Alert and oriented to person, place, and time. PSYCHIATRIC: Normal mood and affect. Normal behavior. Normal judgment and thought content. CARDIOVASCULAR:Not Examined RESPIRATORY: Not Examined BREASTS: Not Examined ABDOMEN: Soft, non distended; Non tender.  No Organomegaly. PELVIC:  External Genitalia: Normal  BUS: Normal  Vagina: Normal; good estrogen effect  Cervix: Normal; nulliparous; high within vaginal vault  Uterus: Normal size, shape,consistency, mobile, midplane  Adnexa: Normal; nonpalpable nontender  RV: Normal external exam  Bladder: Nontender MUSCULOSKELETAL: Normal range of motion. No tenderness.  No cyanosis, clubbing, or edema.  PROCEDURE:IUD Insertion Procedure Note; paracervical block  Pre-operative Diagnosis: 1.  Menorrhagia with irregular menstrual cycles 2.  Atypical complex endometrial hyperplasia  Post-operative Diagnosis: same  Procedure Details  Urine pregnancy test was done  and  result was negative.  The risks (including infection, bleeding, pain, and uterine perforation) and benefits of the procedure were explained to the patient and Verbal informed consent was obtained.    Peterson speculum was placed into the vagina.  Cervix and vagina was cleansed with Betadine.  Single-tooth tenaculum was placed on the anterior of the cervix.  A paracervical block was placed using 1% lidocaine, 10 cc volume at the 3:00 and 9:00 positions.  Uterus sounded to 8 cm. IUD inserted without difficulty. String visible and trimmed-3 cm in length. Patient tolerated procedure well.  IUD Information: Mirena, Lot # [id number], Expiration date Nov 2021.  Condition: Stable  Complications: None  Plan:  The patient was advised to call for any fever or for prolonged or severe pain or bleeding. She was advised to use OTC acetaminophen and OTC ibuprofen as needed for mild to moderate pain.   Attending Physician Documentation: Herold Harms,  MD    Assessment:   1. Complex endometrial hyperplasia with atypia  2. Menorrhagia with irregular cycle  3. Morbid obesity with BMI of 40.0-44.9, adult [facility]) - Referral to Nutrition and Diabetes Services  4. PCO (polycystic ovaries) - POCT urine pregnancy     Plan:   1.  Paracervical block 2.  Mirena IUD insertion 3.  Begin Metformin 500 mg daily nightly 4.  Dietary consultation for weight loss due to morbid obesity and contributory insulin resistance from PCO 5.  Return in 4 weeks for IUD string check 6.  Plan of management for complex atypical endometrial hyperplasia in the patient who desires pregnancy was reviewed.  Repeat endometrial biopsy will be performed in 6 months.  The patient had multiple questions addressed.  A total of 15 minutes were spent face-to-face with the patient during this encounter and over half of that time dealt with counseling and coordination of care.  Herold Harms, MD  Note: This dictation was prepared with Dragon dictation along with smaller phrase technology. Any transcriptional errors that result from this process are unintentional.

## 2017-11-13 ENCOUNTER — Encounter: Payer: BC Managed Care – PPO | Admitting: Obstetrics and Gynecology

## 2017-11-23 ENCOUNTER — Encounter: Payer: Self-pay | Admitting: Obstetrics and Gynecology

## 2017-12-11 ENCOUNTER — Ambulatory Visit (INDEPENDENT_AMBULATORY_CARE_PROVIDER_SITE_OTHER): Payer: BC Managed Care – PPO | Admitting: Obstetrics and Gynecology

## 2017-12-11 VITALS — BP 106/72 | HR 93 | Ht 63.0 in | Wt 249.0 lb

## 2017-12-11 DIAGNOSIS — Z30431 Encounter for routine checking of intrauterine contraceptive device: Secondary | ICD-10-CM | POA: Diagnosis not present

## 2017-12-11 DIAGNOSIS — Z6841 Body Mass Index (BMI) 40.0 and over, adult: Secondary | ICD-10-CM

## 2017-12-11 DIAGNOSIS — N8502 Endometrial intraepithelial neoplasia [EIN]: Secondary | ICD-10-CM

## 2017-12-11 NOTE — Progress Notes (Signed)
Pt is here for a 4 week followup on IUD. States she has some slight spotting and occasional cramping.  Chief complaint: 1.  IUD string check, status post insertion 2.  History of menorrhagia with irregular menstrual cycles 3.  Atypical complex endometrial hyperplasia 4.  Obesity  Patient presents for follow-up.  IUD was inserted last month.  She has had intermittent spotting without heavy bleeding since IUD insertion.  She does have occasional pelvic cramping, more so after intercourse.  There is no increased bleeding with intercourse.  Patient has stopped with her Megace since the Mirena was inserted. Patient does have dietary counseling appointment scheduled in the next few weeks for management of obesity.  She is to begin increasing exercise to help regulate her weight.  Insurance issues were discussed today regarding the state insurance plan and the possible lack of availability of insurance in the future should this day plan be rejected. Questions regarding early removal of IUD for management of complex atypical endometrial hyperplasia were addressed because of the potential loss of insurance.  She was advised that the IUD should not be removed at a minimum of 6 months following insertion in order to adequately treat the hyperplasia.  Past medical history, past surgical history, problem list, medications, and allergies are reviewed  OBJECTIVE: BP 106/72   Pulse 93   Ht 5\' 3"  (1.6 m)   Wt 249 lb (112.9 kg)   BMI 44.11 kg/m  Pleasant female no acute distress.  Alert and oriented. Abdomen: Soft, nontender without organomegaly Pelvic exam: External genitalia-normal BUS-normal Vagina-old menstrual blood in the vault; no bright red bleeding Cervix-IUD strings 2.5 cm in length; no cervical motion tenderness Uterus-midplane, nonenlarged, nontender Adnexa-nonpalpable nontender Rectovaginal-normal external exam  ASSESSMENT: 1.  4 weeks status post Mirena IUD insertion-normal IUD  check 2.  Morbid obesity-plan for dietary modification with dietary consult and increased exercise is to be initiated 3.  Megace is stopped 4.  Pelvic cramping, chronic, without identifiable pathology 5.  Complex atypical endometrial hyperplasia, managed with the Mirena IUD 6.  Chronic spotting  PLAN: 1.  Maintain menstrual calendar monitoring 2.  Follow-up with dietary consultation and begin exercise regimen 3.  Follow-up in 3 months.  A total of 15 minutes were spent face-to-face with the patient during this encounter and over half of that time dealt with counseling and coordination of care.  Herold HarmsMartin A Maisyn Nouri, MD  Note: This dictation was prepared with Dragon dictation along with smaller phrase technology. Any transcriptional errors that result from this process are unintentional.

## 2017-12-11 NOTE — Patient Instructions (Signed)
1.  IUD strings are of appropriate length-2.5 cm 2.  Maintain menstrual calendar monitoring for any abnormal uterine bleeding and monitoring of menses 3.  Return in 3 months for follow-up.

## 2017-12-24 ENCOUNTER — Encounter: Payer: Self-pay | Admitting: Dietician

## 2017-12-24 ENCOUNTER — Ambulatory Visit: Payer: BC Managed Care – PPO | Admitting: Dietician

## 2018-01-04 ENCOUNTER — Encounter: Payer: BC Managed Care – PPO | Attending: Obstetrics and Gynecology | Admitting: Dietician

## 2018-01-04 ENCOUNTER — Encounter: Payer: Self-pay | Admitting: Dietician

## 2018-01-04 VITALS — Ht 63.0 in | Wt 249.2 lb

## 2018-01-04 DIAGNOSIS — Z6841 Body Mass Index (BMI) 40.0 and over, adult: Secondary | ICD-10-CM | POA: Diagnosis not present

## 2018-01-04 NOTE — Progress Notes (Signed)
Medical Nutrition Therapy: Visit start time: 1530  end time: 1630  Assessment:  Diagnosis: Obesity class III Past medical history: PCOS, see chart Psychosocial issues/ stress concerns: reports high levels of stress d/t trying to balance job, teaching dance team, cooking, managing bills and keeping up with her son's schedule  Preferred learning method:  . Auditory  Current weight: 249.2#  Height: 5\' 3"  Medications, supplements: Metformin, Advil prn, recently stopped Megace, see chart  Progress and evaluation: Patient has been struggling with weight loss but has been told it will be an essential part of cancer prevention d/t atypical complex hydroplasia dx. Also dx with PCOS. She recently started taking Metformin to help improve insulin resistance in addition to making diet changes. Dieting history includes a low carb diet and intermittent fasting. She disliked the restriction of the low carb diet but has continued intermittent fasting since June of this year (8pm-10am fasting window). States I.F. has helped reduce her evening snacking which was previously an area of struggle for her. Recent diet changes include reducing added sugar intake by eliminating soda x3 months and reducing ice cream intake from nightly to a couple of times per week at most, eating multiple servings of fruits and vegetables daily, being more mindful of food choices available at home for dinners and snacks (her son is trying to eat healthier too), choosing sweet potatoes over regular potatoes, and not eating red meat. She does not include a meat at every meal and dislikes dairy; drinks soy milk. She cooks using avocado oil, sesame oil and olive oil. During the school year she does not get a lunch break and so finds herself snacking throughout the morning and through lunch, rather than having a complete/ balanced meal. Brings food to eat to work each day. Reports not liking "dry" foods, and so generally uses sauces, condiments, and  creamy dressing to add moisture. However, lately she has started to measure out more appropriate portions of these additives. She is thinking about utilizing the [employer] program to save her time during the week.   Physical activity: joined [employer] gym recently and attends classes 2-3 days/week which are cardio-based. Planning to do additional classes in the future, possibly adding in a resistance training class. 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  Dietary Intake:  Usual eating pattern includes 2-3 meals and 1-3 snacks per day. Dining out frequency: 3-4 meals per week (sushi, salads, Asian cuisine, [place] food, fast food occasionally).  Breakfast: before June: sausage biscuit, poptarts, high sugar cereal; now: does not eat until 10am  Snack: cashews, carrots, beef jerky, Pringles, Protein coffee using [employer] Lunch: salad, fruit + string cheese + deli meat, leftovers, sandwich on sandwich thin bread, bacon lettuce wrap Snack: not usually Supper: pasta + salad, grilled chicken wings + a vegetable, various vegetables with a meat, grains included sometimes Snack: used to eat chips and ice cream. No food after 8pm since June Beverages: water, diet soda occasionally  Nutrition Care Education: Topics covered: healthy and unhealthy fats, fiber and BG control/ satiety, ways to decrease insulin resistance, role of cortisol and inability to lose weight, ways to plan meals ahead of time, on-the-go breakfast options Basic nutrition: basic food groups, appropriate nutrient balance, appropriate meal and snack schedule, general nutrition guidelines    Weight control: benefits of weight control, behavioral changes for weight loss Advanced nutrition: cooking techniques,  dining out, food label reading Other lifestyle changes: benefits of making changes, increasing motivation,  readiness for change, identifying habits that need to change  Nutritional Diagnosis:  [place]-3.3 Overweight/obesity As related to lifestyle habits, Dx of PCOS.  As evidenced by BMI 44.14, patient report of inability to lose weight desite several attempts.  Intervention: Discussion as noted above. She will continue to work on reducing her added sugar intake and on moderating her "add-ons" such as sauces and condiments. She is considering stopping the intermittent fasting d/t feeling lightheaded in the mornings often and will work to include sources of protein and healthy fats regularly. She is also considering starting a Vitamin B-complex supplement d/t lack of energy.  Education Materials given:  . PCOS dietary suggestions . Goals/ instructions  Learner/ who was taught:  . Patient  Level of understanding: Marland Kitchen. Verbalizes/ demonstrates competency  Demonstrated degree of understanding via:   Teach back Learning barriers: . None  Willingness to learn/ readiness for change: . Eager, change in progress  Monitoring and Evaluation:  Dietary intake, exercise, and body weight      follow up: prn- email provided for questions

## 2018-01-04 NOTE — Patient Instructions (Addendum)
   Look for [employer] dressings- lower fat creamy dressing resource  Stress and sleep management play huge roles in ability to lose weight , don't underestimate them!  Add resistance training to your exercise routine at least once per week  Ghee, whipped butter, or oil-based butter over margarine  Consider a Vitamin B complex vitamin and Fish oil supplement (2:1 ratio of EPA to DHA)  Limit refined "simple" carbohydrate sources (white bread, pastries)  Limit sources of added/ refined sugar  Fill half your plate with fiber rich vegetables, 1/4 plate protein, 1/4 starch *eat multiple servings of fruits and veggies per day  Include a fat and protein with each meal  Sources of healthy fats: eggs, nuts and seeds, nut butter, salmon, trout, tuna, avocado, olives, oils

## 2018-03-06 ENCOUNTER — Encounter: Payer: Self-pay | Admitting: Obstetrics and Gynecology

## 2018-03-06 ENCOUNTER — Ambulatory Visit (INDEPENDENT_AMBULATORY_CARE_PROVIDER_SITE_OTHER): Payer: BC Managed Care – PPO | Admitting: Obstetrics and Gynecology

## 2018-03-06 ENCOUNTER — Encounter: Payer: BC Managed Care – PPO | Admitting: Obstetrics and Gynecology

## 2018-03-06 VITALS — BP 114/74 | HR 105 | Ht 63.0 in | Wt 248.2 lb

## 2018-03-06 DIAGNOSIS — N921 Excessive and frequent menstruation with irregular cycle: Secondary | ICD-10-CM | POA: Diagnosis not present

## 2018-03-06 DIAGNOSIS — E282 Polycystic ovarian syndrome: Secondary | ICD-10-CM | POA: Diagnosis not present

## 2018-03-06 DIAGNOSIS — N8502 Endometrial intraepithelial neoplasia [EIN]: Secondary | ICD-10-CM

## 2018-03-06 NOTE — Patient Instructions (Signed)
1.  Increase metformin to 500 mg twice a day 2.  Continue with Mirena IUD use until the first week in December. 3.  Return the first week in December to have Mirena IUD removed and endometrial biopsy 4.  If endometrial biopsy is benign, we will then proceed with Clomid therapy for ovulation induction.

## 2018-03-06 NOTE — Progress Notes (Signed)
Chief complaint: 1.  Complex atypical endometrial hyperplasia 2.  History of menorrhagia with irregular menstrual cycle 3.  PCO  Pamela Warren presents today for follow-up.  She is now 4 months into her Mirena IUD therapy for complex atypical endometrial hyperplasia.  She also had a month of Megace therapy prior to having the IUD placed. Plan is for the IUD to be in place for 6 months before removal and repeat endometrial biopsy to assess for any residual hyperplasia.  This will be performed in December 2019.  Pamela Warren states that she is having cycles every other month with approximately 4 to 5 days of spotting and 3 to 5 days of bleeding.  She is not having any significant pelvic pain or cramping.  The bleeding is light.  Following progestin therapy for 6 months, she is wanting to proceed with ovulation induction for attempted pregnancy.  Previously she was on Clomid 100 mg days 5 through 9 with success.  She would like to start on the same dosage if possible.  OBJECTIVE: BP 114/74   Pulse (!) 105   Ht 5\' 3"  (1.6 m)   Wt 248 lb 3.2 oz (112.6 kg)   LMP 03/05/2018 (Exact Date)   BMI 43.97 kg/m  Physical exam is deferred  ASSESSMENT: 1.  Complex atypical endometrial hyperplasia on biopsy 2.  5 months into progestin therapy with regulation of menorrhagia 3.  Desires future conception 4.  Currently on metformin 500 mg a day for PCO  PLAN: 1.  Increase metformin to 500 mg twice a day 2.  Continue with Mirena IUD in place until first week of December 3.  Return first week of December for IUD removal and endometrial biopsy 4.  Ovulation induction will follow after review of endometrial biopsy results.  A total of 15 minutes were spent face-to-face with the patient during this encounter and over half of that time dealt with counseling and coordination of care.  Herold Harms, MD  Note: This dictation was prepared with Dragon dictation along with smaller phrase technology. Any  transcriptional errors that result from this process are unintentional.

## 2018-04-24 ENCOUNTER — Encounter: Payer: Self-pay | Admitting: Obstetrics and Gynecology

## 2018-04-24 ENCOUNTER — Other Ambulatory Visit (HOSPITAL_COMMUNITY)
Admission: RE | Admit: 2018-04-24 | Discharge: 2018-04-24 | Disposition: A | Discharge status: Home / Self Care | Payer: BC Managed Care – PPO | Source: Ambulatory Visit | Attending: Obstetrics and Gynecology | Admitting: Obstetrics and Gynecology

## 2018-04-24 ENCOUNTER — Ambulatory Visit (INDEPENDENT_AMBULATORY_CARE_PROVIDER_SITE_OTHER): Payer: BC Managed Care – PPO | Admitting: Obstetrics and Gynecology

## 2018-04-24 VITALS — BP 136/84 | HR 88 | Ht 63.0 in | Wt 250.3 lb

## 2018-04-24 DIAGNOSIS — N979 Female infertility, unspecified: Secondary | ICD-10-CM

## 2018-04-24 DIAGNOSIS — N8502 Endometrial intraepithelial neoplasia [EIN]: Secondary | ICD-10-CM

## 2018-04-24 DIAGNOSIS — N840 Polyp of corpus uteri: Secondary | ICD-10-CM

## 2018-04-24 DIAGNOSIS — Z30432 Encounter for removal of intrauterine contraceptive device: Secondary | ICD-10-CM

## 2018-04-24 DIAGNOSIS — N970 Female infertility associated with anovulation: Secondary | ICD-10-CM | POA: Diagnosis not present

## 2018-04-24 DIAGNOSIS — N926 Irregular menstruation, unspecified: Secondary | ICD-10-CM

## 2018-04-24 MED ORDER — MEDROXYPROGESTERONE ACETATE 10 MG PO TABS: 10.0000 mg | ORAL_TABLET | Freq: Every day | ORAL | 0 refills | Status: DC

## 2018-04-24 MED ORDER — CLOMIPHENE CITRATE 50 MG PO TABS: 100.0000 mg | ORAL_TABLET | Freq: Every day | ORAL | 1 refills | Status: DC

## 2018-04-24 NOTE — Progress Notes (Signed)
Chief complaint: 1.  History of atypical endometrial hyperplasia 2.  IUD removal 3.  Ovulation induction desired  Pamela Warren presents today in follow-up.  She is now 6 months status post progestin therapy following history of atypical endometrial hyperplasia which was seen on endometrial biopsy 09/11/2017, as well as on 10/05/2017 at the time of a D&C specimen.  During that surgical procedure laparoscopy with RSO was also performed because of a 9 cm mass which was stuck on the appendix, and tubal anatomy was markedly distorted and not visible.  Pathology from surgery at that time Surgical Pathology  CASE: [id number]  PATIENT: Pamela Warren  Surgical Pathology Report  SPECIMEN SUBMITTED:  A. Endometrial curettings  B. Adnexal mass, right, including right tube, ovary and appendix   CLINICAL HISTORY:  None provided   PRE-OPERATIVE DIAGNOSIS:  Complex endometrial hyperplasia with atypia, morbid obesity with BMI  40.0-44.9, cyst of ovary, pelvic pain   POST-OPERATIVE DIAGNOSIS:  Same as pre-op   DIAGNOSIS:  A. 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  - DECIDUALIZED STROMA AND EPITHELIAL METAPLASTIC CHANGES CONSISTENT WITH  PROGESTIN EFFECT.  - ACUTE INFLAMMATION AND BREAKDOWN.   B. ADENEXAL MASS, RIGHT AND APPENDIX; RIGHT SALPINGOOPHORECTOMY AND  APPENDECTOMY:  - CYSTIC DILATION OF FALLOPIAN TUBE.  - TUBO-OVARIAN-APPENDICEAL ADHESIONS.  - FOLLICULAR OVARIAN CYST WITH BACKGROUND CYSTIC FOLLICLES.  - NEGATIVE FOR ATYPIA AND MALIGNANCY.   The patient has been having regular light cycles with the IUD in place. She is now desiring to conceive. Previously she underwent ovulation induction and was notably ovulatory on a dose of 100 mg of Clomid.  Past medical history, past surgical history, problem list, medications, and allergies are  reviewed  OBJECTIVE: BP 136/84   Pulse 88   Ht 5\' 3"  (1.6 m)   Wt 250 lb 4.8 oz (113.5 kg)   LMP 04/24/2018   BMI 44.34 kg/m  Pleasant well-appearing female no acute distress.  Alert and oriented.  Affect is appropriate. Abdomen: Soft, nontender without organomegaly Pelvic exam: External genitalia-normal BUS-normal Vagina-normal Cervix-nulliparous; IUD strings noted; no lesions; no cervical motion tenderness Uterus-midplane, mobile, normal size and shape, nontender Adnexa-nonpalpable nontender Rectovaginal normal external exam  PROCEDURE: Mirena IUD removal Verbal consent is obtained.  Patient is placed in dorsolithotomy position.  Long Pederson speculum is placed in the vagina to facilitate visualization of the cervix.  [place] forceps are used to grasp IUD strings, and with gentle traction the IUD is removed.  No complications were encountered.  Blood loss is minimal.  PROCEDURE:Endometrial Biopsy Procedure Note  Pre-operative Diagnosis: 1.  History of complex atypical endometrial hyperplasia 2.  Status post 6 months of progestin therapy  Post-operative Diagnosis: same  Indications: followup to previous abnormal endometrial biopsy  Procedure Details   Urine pregnancy test was not done.  The risks (including infection, bleeding, pain, and uterine perforation) and benefits of the procedure were explained to the patient and Verbal informed consent was obtained.  Antibiotic prophylaxis against endocarditis was not indicated.   The patient was placed in the dorsal lithotomy position.  Bimanual exam showed the uterus to be in the neutral position.  A Graves' speculum inserted in the vagina, and the cervix prepped with povidone iodine.  Endocervical curettage with a Kevorkian curette was not performed.   A sharp tenaculum was not applied to the anterior lip of the cervix for stabilization.  A sterile uterine sound was used  to sound the uterus to a depth of 7.5cm.  A Mylex 3mm  curette was used to sample the endometrium.  Sample was sent for pathologic examination.  Condition: Stable  Complications: None  Plan:  The patient was advised to call for any fever or for prolonged or severe pain or bleeding. She was advised to use OTC acetaminophen and OTC ibuprofen as needed for mild to moderate pain. She was advised to avoid vaginal intercourse for 48 hours or until the bleeding has completely stopped.  Attending Physician Documentation: Herold HarmsMartin A Harlowe Dowler, MD    ASSESSMENT: 1.  History of complex atypical endometrial hyperplasia 2.  Status post 6 months of progestin therapy 3.  Successful removal of Mirena IUD 4.  Completion of endometrial biopsy as noted 5.  Patient desires ovulation induction with attempt at conception  PLAN: 1.  Mirena IUD is removed 2.  Endometrial biopsy is performed 3.  Provera 10 mg a day for 10 days is prescribed for menses induction. 4.  Clomid 100 mg a day days 5 through 9 is ordered for ovulation induction 5.  Timed intercourse is to be performed on days 12, 14, and 16 of cycle 6.  Day 22 serum progesterone level is to be obtained 7.  Patient is to return in 6 weeks for follow-up on ovulation induction and further management planning-Dr. Andrey CotaEvans  Pamela Glab A Arian Mcquitty, MD  Note: This dictation was prepared with Dragon dictation along with smaller phrase technology. Any transcriptional errors that result from this process are unintentional.

## 2018-04-24 NOTE — Patient Instructions (Signed)
1.  IUD is removed today 2.  Endometrial biopsy is performed. 3.  Return in 6 weeks to see Dr. Logan BoresEvans in follow-up 4.  Provera 10 mg a day for 10 days is given to induce menses. 5.  Begin Clomid therapy 100 mg a day days 5 through 9 for ovulation induction 6.  Timed intercourse is recommended on days 12, 14, and 16 of cycle. 7.  Patient is to have day 22 serum progesterone level drawn to confirm ovulation 8.  Endometrial biopsies results will be made available through my chart.   Endometrial Biopsy, Care After This sheet gives you information about how to care for yourself after your procedure. Your health care provider may also give you more specific instructions. If you have problems or questions, contact your health care provider. What can I expect after the procedure? After the procedure, it is common to have:  Mild cramping.  A small amount of vaginal bleeding for a few days. This is normal.  Follow these instructions at home:  Take over-the-counter and prescription medicines only as told by your health care provider.  Do not douche, use tampons, or have sexual intercourse until your health care provider approves.  Return to your normal activities as told by your health care provider. Ask your health care provider what activities are safe for you.  Follow instructions from your health care provider about any activity restrictions, such as restrictions on strenuous exercise or heavy lifting. Contact a health care provider if:  You have heavy bleeding, or bleed for longer than 2 days after the procedure.  You have bad smelling discharge from your vagina.  You have a fever or chills.  You have a burning sensation when urinating or you have difficulty urinating.  You have severe pain in your lower abdomen. Get help right away if:  You have severe cramps in your stomach or back.  You pass large blood clots.  Your bleeding increases.  You become weak or light-headed, or  you pass out. Summary  After the procedure, it is common to have mild cramping and a small amount of vaginal bleeding for a few days.  Do not douche, use tampons, or have sexual intercourse until your health care provider approves.  Return to your normal activities as told by your health care provider. Ask your health care provider what activities are safe for you. This information is not intended to replace advice given to you by your health care provider. Make sure you discuss any questions you have with your health care provider. Document Released: 02/26/2013 Document Revised: 05/24/2016 Document Reviewed: 05/24/2016 Elsevier Interactive Patient Education  2017 [employer]

## 2018-05-07 ENCOUNTER — Telehealth: Payer: Self-pay | Admitting: Obstetrics and Gynecology

## 2018-05-07 NOTE — Telephone Encounter (Signed)
Patient called stating her pharmacy advised her to call us and let us know when needed to complete a prior authorization on her medication. She also has questions about when she is supposed to start the medication.Thanks

## 2018-05-08 NOTE — Telephone Encounter (Signed)
LM for patient to return call.

## 2018-05-13 NOTE — Telephone Encounter (Signed)
LM for patient to return call.

## 2018-06-05 ENCOUNTER — Encounter: Payer: BC Managed Care – PPO | Admitting: Obstetrics and Gynecology

## 2018-06-06 NOTE — Telephone Encounter (Signed)
Spoke with patient and she canceled appointment with Dr. Logan Bores due to not having a cycle yet. She will call back to schedule lab and appointment when she has one.

## 2018-07-10 ENCOUNTER — Telehealth: Payer: Self-pay | Admitting: Obstetrics and Gynecology

## 2018-07-10 NOTE — Telephone Encounter (Signed)
Spoke with patient and it sounds like she is having her period. Per dr. Valentino Saxon it sounds like cycle and she can start the clomid. Patient will com in on day 22 for blood draw. She will call back with any questions.

## 2018-07-10 NOTE — Telephone Encounter (Signed)
Dr. Tommi Rumps put her on Provera, but not on it since December, and IUD removal per patient, and she is now ready to start on the Clomid.  She is spotting very lightly, and wanting to verify she is on her cycle before she starts the Clomid.  She is asking for Asher Muir to call her back today as this is VERY TIME SENSITIVE to her so she can verify she is on day 3 or 4 of cycle, please advise, thanks.

## 2018-07-25 ENCOUNTER — Telehealth: Payer: Self-pay | Admitting: Obstetrics and Gynecology

## 2018-07-25 NOTE — Telephone Encounter (Signed)
The patient called and asked to schedule her day 22 blood work and there is an old order from Dr. Greggory Keen in her chart, but this may need to be updated by Dr. Logan Bores.  The lab appointment was scheduled based on a note from the nurse with the patient in MyChart, stating it was ok to come in on day 22 to get it done.  She also paid her form fee to get her STD paperwork completed.  That will be placed in the doctor's box once it arrives via fax, please advise, thanks.

## 2018-07-26 NOTE — Telephone Encounter (Signed)
OK to place order in your name.

## 2018-07-29 ENCOUNTER — Other Ambulatory Visit: Payer: BC Managed Care – PPO

## 2018-07-29 ENCOUNTER — Other Ambulatory Visit: Payer: Self-pay | Admitting: Obstetrics and Gynecology

## 2018-07-29 DIAGNOSIS — N970 Female infertility associated with anovulation: Secondary | ICD-10-CM

## 2018-07-30 LAB — PROGESTERONE: Progesterone: 1.2 ng/mL

## 2018-07-31 NOTE — Telephone Encounter (Signed)
Order was placed in Dr. Valentino Saxon name.

## 2018-08-07 ENCOUNTER — Other Ambulatory Visit: Payer: Self-pay

## 2018-08-07 ENCOUNTER — Ambulatory Visit: Payer: Self-pay | Admitting: Obstetrics and Gynecology

## 2018-08-07 ENCOUNTER — Encounter: Payer: Self-pay | Admitting: Obstetrics and Gynecology

## 2018-08-07 ENCOUNTER — Ambulatory Visit: Payer: BC Managed Care – PPO | Admitting: Obstetrics and Gynecology

## 2018-08-07 VITALS — BP 124/86 | HR 106 | Ht 63.0 in | Wt 239.3 lb

## 2018-08-07 DIAGNOSIS — E8881 Metabolic syndrome: Secondary | ICD-10-CM

## 2018-08-07 DIAGNOSIS — E282 Polycystic ovarian syndrome: Secondary | ICD-10-CM | POA: Diagnosis not present

## 2018-08-07 DIAGNOSIS — N970 Female infertility associated with anovulation: Secondary | ICD-10-CM | POA: Diagnosis not present

## 2018-08-07 DIAGNOSIS — Z30011 Encounter for initial prescription of contraceptive pills: Secondary | ICD-10-CM

## 2018-08-07 DIAGNOSIS — Z6841 Body Mass Index (BMI) 40.0 and over, adult: Secondary | ICD-10-CM

## 2018-08-07 MED ORDER — DESOGESTREL-ETHINYL ESTRADIOL 0.15-0.02/0.01 MG (21/5) PO TABS: 1.0000 | ORAL_TABLET | Freq: Every day | ORAL | 0 refills | Status: DC

## 2018-08-07 NOTE — Progress Notes (Signed)
HPI:      [name] is a 28 y.o. G0P0000 who LMP was No LMP recorded.  Subjective:   She presents today for follow-up of her PCO and anovulation.  A brief summary is that she had irregular cycles since the age of 62 and was diagnosed with PCO.  She subsequently had a right sided mass which turned out to be appendix right tube and ovary which were removed.  She was also found to have endometrial hyperplasia and was placed on an IUD and subsequent biopsy revealed resolution of the hyperplasia.  Her IUD was removed 2 and half months ago and since that time her intention is to become pregnant.  She has not had regular cycles or in fact any normal menses since her IUD removal.  She did take 1 cycle of Clomid but was not sure when to start it and a "mid cycle progesterone" revealed no evidence of ovulation. Her partner has fathered a baby 10 years ago.    Hx: The following portions of the patient's history were reviewed and updated as appropriate:             She  has a past medical history of Complex endometrial hyperplasia, Heavy menstrual bleeding, Infertility, female, Ovarian cyst, Ovarian cyst, and PCOS (polycystic ovarian syndrome). She does not have any pertinent problems on file. She  has a past surgical history that includes Dilation and curettage of uterus; Hysteroscopy w/D&C (N/A, 10/05/2017); laparoscopic appendectomy (N/A, 10/05/2017); Laparoscopic lysis of adhesions (N/A, 10/05/2017); Laparoscopic salpingo oophorectomy (Right, 10/05/2017); and Laparoscopic ovarian cystectomy (Right, 10/05/2017). Her family history includes Breast cancer in her maternal grandmother; Cervical cancer in an other family member; Colon cancer in an other family member; Diabetes in her maternal grandmother and mother; Lung cancer in her paternal grandmother; Lupus in her paternal grandmother. She  reports that she has never smoked. She has never used smokeless tobacco. She reports current alcohol use. She  reports that she does not use drugs. She has a current medication list which includes the following prescription(s): clomiphene, metformin, and desogestrel-ethinyl estradiol. She has No Known Allergies.       Review of Systems:  Review of Systems  Constitutional: Denied constitutional symptoms, night sweats, recent illness, fatigue, fever, insomnia and weight loss.  Eyes: Denied eye symptoms, eye pain, photophobia, vision change and visual disturbance.  Ears/Nose/Throat/Neck: Denied ear, nose, throat or neck symptoms, hearing loss, nasal discharge, sinus congestion and sore throat.  Cardiovascular: Denied cardiovascular symptoms, arrhythmia, chest pain/pressure, edema, exercise intolerance, orthopnea and palpitations.  Respiratory: Denied pulmonary symptoms, asthma, pleuritic pain, productive sputum, cough, dyspnea and wheezing.  Gastrointestinal: Denied, gastro-esophageal reflux, melena, nausea and vomiting.  Genitourinary: Denied genitourinary symptoms including symptomatic vaginal discharge, pelvic relaxation issues, and urinary complaints.  Musculoskeletal: Denied musculoskeletal symptoms, stiffness, swelling, muscle weakness and myalgia.  Dermatologic: Denied dermatology symptoms, rash and scar.  Neurologic: Denied neurology symptoms, dizziness, headache, neck pain and syncope.  Psychiatric: Denied psychiatric symptoms, anxiety and depression.  Endocrine: Denied endocrine symptoms including hot flashes and night sweats.   Meds:   Current Outpatient Medications on File Prior to Visit  Medication Sig Dispense Refill  . clomiPHENE (CLOMID) 50 MG tablet Take 2 tablets (100 mg total) by mouth daily. Days 5-9 of cycle. 5 tablet 1  . metFORMIN (GLUCOPHAGE) 500 MG tablet Take one tablet by mouth daily 60 tablet 3   No current facility-administered medications on file prior to visit.     Objective:  Vitals:   08/07/18 0929  BP: 124/86  Pulse: (!) 106                Assessment:     G0P0000 Patient Active Problem List   Diagnosis Date Noted  . Status post laparoscopy with RSO/appendectomy/adhesiolysis 10/11/2017  . Status post D&C/hysteroscopy 10/11/2017  . Menorrhagia with irregular cycle 09/17/2017  . 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 Complex endometrial hyperplasia with atypia 09/17/2017  . Irregular periods/menstrual cycles 08/25/2015  . Morbid obesity with BMI of 40.0-44.9, adult ([facility]) 08/25/2015  . Infertility, female 08/25/2015     1. PCO (polycystic ovaries)   2. Anovulation   3. Class 3 severe obesity due to excess calories without serious comorbidity with body mass index (BMI) of 40.0 to 44.9 in adult ([facility]CC)   4. Insulin resistance   5. Initiation of OCP (BCP)        Plan:            1.  We have discussed PCO anovulation endometrial hyperplasia unilateral salpingectomy and oophorectomy in detail.  All of her questions were answered.  2.  We will plan 3 months of OCPs followed by ovulation predictor kits and timed intercourse.  I have advised a semen analysis but the patient states that this time she cannot afford it and would like to delay it as long as possible until insurance could possibly cover it.  3.  If the patient ovulates after OCPs we will continue attempting pregnancy as long as ovulation continues.  If cycles become irregular consider another course of OCPs followed by letrozole cycles. I have discussed all of the above in detail with the patient. Orders No orders of the defined types were placed in this encounter.    Meds ordered this encounter  Medications  . desogestrel-ethinyl estradiol (MIRCETTE) 0.15-0.02/0.01 MG (21/5) tablet    Sig: Take 1 tablet by mouth at bedtime.    Dispense:  3 Package    Refill:  0      F/U  Return in about 3 months (around 11/07/2018). I spent 28 minutes involved in the care of this patient of which greater than 50% was spent discussing please see the plan for detailed discussion  regarding anovulation PCO infertility and continued work-up and treatment going forward.  Elonda Husky, M.D. 08/07/2018 11:33 AM

## 2018-08-07 NOTE — Progress Notes (Signed)
Patient is coming today to discuss cycles and getting pregnant.

## 2018-08-27 ENCOUNTER — Other Ambulatory Visit: Payer: Self-pay

## 2018-08-28 ENCOUNTER — Other Ambulatory Visit: Payer: Self-pay | Admitting: Surgical

## 2018-08-28 MED ORDER — METFORMIN HCL 500 MG PO TABS: ORAL_TABLET | ORAL | 1 refills | Status: DC

## 2018-11-06 ENCOUNTER — Ambulatory Visit: Payer: BC Managed Care – PPO | Admitting: Obstetrics and Gynecology

## 2018-11-06 ENCOUNTER — Other Ambulatory Visit: Payer: Self-pay

## 2018-11-06 ENCOUNTER — Encounter: Payer: Self-pay | Admitting: Obstetrics and Gynecology

## 2018-11-06 VITALS — BP 115/65 | HR 90 | Ht 63.0 in | Wt 246.0 lb

## 2018-11-06 DIAGNOSIS — N926 Irregular menstruation, unspecified: Secondary | ICD-10-CM

## 2018-11-06 DIAGNOSIS — E282 Polycystic ovarian syndrome: Secondary | ICD-10-CM

## 2018-11-06 DIAGNOSIS — E8881 Metabolic syndrome: Secondary | ICD-10-CM

## 2018-11-06 DIAGNOSIS — Z6841 Body Mass Index (BMI) 40.0 and over, adult: Secondary | ICD-10-CM

## 2018-11-06 NOTE — Progress Notes (Signed)
Patient comes in today for her 3 month follow up. She was put on [facility] to regulate her periods. IT has worked. Her first period was bad, but last two have been normal.

## 2018-11-06 NOTE — Progress Notes (Signed)
HPI:      Pamela Warren is a 28 y.o. G0P0000 who LMP was Patient's last menstrual period was 10/28/2018.  Subjective:   She presents today anovulation, PCO.  She has been taking OCPs and has not had regular cycles for 3 months.  She would like to get pregnant and so she is discontinuing OCPs. She remains reluctant for her husband to have a semen analysis as he has no insurance. She also has insulin resistance and is taking metformin 500 twice daily. She says that she has embarked on a weight loss program    Hx: The following portions of the patient's history were reviewed and updated as appropriate:             She  has a past medical history of Complex endometrial hyperplasia, Heavy menstrual bleeding, Infertility, female, Ovarian cyst, Ovarian cyst, and PCOS (polycystic ovarian syndrome). She does not have any pertinent problems on file. She  has a past surgical history that includes Dilation and curettage of uterus; Hysteroscopy w/D&C (N/A, 10/05/2017); laparoscopic appendectomy (N/A, 10/05/2017); Laparoscopic lysis of adhesions (N/A, 10/05/2017); Laparoscopic salpingo oophorectomy (Right, 10/05/2017); and Laparoscopic ovarian cystectomy (Right, 10/05/2017). Her family history includes Breast cancer in her maternal grandmother; Cervical cancer in an other family member; Colon cancer in an other family member; Diabetes in her maternal grandmother and mother; Lung cancer in her paternal grandmother; Lupus in her paternal grandmother. She  reports that she has never smoked. She has never used smokeless tobacco. She reports current alcohol use. She reports that she does not use drugs. She has a current medication list which includes the following prescription(s): metformin, clomiphene, and desogestrel-ethinyl estradiol. She has No Known Allergies.       Review of Systems:  Review of Systems  Constitutional: Denied constitutional symptoms, night sweats, recent illness, fatigue, fever,  insomnia and weight loss.  Eyes: Denied eye symptoms, eye pain, photophobia, vision change and visual disturbance.  Ears/Nose/Throat/Neck: Denied ear, nose, throat or neck symptoms, hearing loss, nasal discharge, sinus congestion and sore throat.  Cardiovascular: Denied cardiovascular symptoms, arrhythmia, chest pain/pressure, edema, exercise intolerance, orthopnea and palpitations.  Respiratory: Denied pulmonary symptoms, asthma, pleuritic pain, productive sputum, cough, dyspnea and wheezing.  Gastrointestinal: Denied, gastro-esophageal reflux, melena, nausea and vomiting.  Genitourinary: Denied genitourinary symptoms including symptomatic vaginal discharge, pelvic relaxation issues, and urinary complaints.  Musculoskeletal: Denied musculoskeletal symptoms, stiffness, swelling, muscle weakness and myalgia.  Dermatologic: Denied dermatology symptoms, rash and scar.  Neurologic: Denied neurology symptoms, dizziness, headache, neck pain and syncope.  Psychiatric: Denied psychiatric symptoms, anxiety and depression.  Endocrine: Denied endocrine symptoms including hot flashes and night sweats.   Meds:   Current Outpatient Medications on File Prior to Visit  Medication Sig Dispense Refill  . metFORMIN (GLUCOPHAGE) 500 MG tablet Take one tablet by mouth daily 60 tablet 1  . clomiPHENE (CLOMID) 50 MG tablet Take 2 tablets (100 mg total) by mouth daily. Days 5-9 of cycle. (Patient not taking: Reported on 11/06/2018) 5 tablet 1  . desogestrel-ethinyl estradiol (MIRCETTE) 0.15-0.02/0.01 MG (21/5) tablet Take 1 tablet by mouth at bedtime. (Patient not taking: Reported on 11/06/2018) 3 Package 0   No current facility-administered medications on file prior to visit.     Objective:     Vitals:   11/06/18 0807  BP: 115/65  Pulse: 90                Assessment:    G0P0000 Patient Active Problem List   Diagnosis Date  Noted  . Status post laparoscopy with RSO/appendectomy/adhesiolysis 10/11/2017   . Status post D&C/hysteroscopy 10/11/2017  . Menorrhagia with irregular cycle 09/17/2017  . 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 Complex endometrial hyperplasia with atypia 09/17/2017  . Irregular periods/menstrual cycles 08/25/2015  . Morbid obesity with BMI of 40.0-44.9, adult ([place]) 08/25/2015  . Infertility, female 08/25/2015     1. Irregular periods/menstrual cycles   2. Morbid obesity with BMI of 40.0-44.9, adult ([place])   3. PCO (polycystic ovaries)   4. Insulin resistance        Plan:            1.  Discontinue OCPs.  Ovulation predictor kits as directed with timed intercourse.  2.  If patient does not become pregnant to consider OCPs again for 3 months followed by another attempt  3.  Letrozole with next attempt if patient desires.  Strongly recommend semen analysis prior to her letrozole use.  I have discussed this with the patient in detail.  4.  Continue metformin Orders No orders of the defined types were placed in this encounter.   No orders of the defined types were placed in this encounter.     F/U  No follow-ups on file. I spent 18 minutes involved in the care of this patient of which greater than 50% was spent discussing PCO, metformin, ovulation after OCPs, infertility based on PCO and anovulation, previous ovarian surgery, necessity of semen analysis.  All questions answered  Finis Bud, M.D. 11/06/2018 8:43 AM

## 2019-01-06 ENCOUNTER — Other Ambulatory Visit: Payer: Self-pay

## 2019-01-06 DIAGNOSIS — Z20822 Contact with and (suspected) exposure to covid-19: Secondary | ICD-10-CM

## 2019-01-08 LAB — NOVEL CORONAVIRUS, NAA: SARS-CoV-2, NAA: NOT DETECTED

## 2019-02-06 ENCOUNTER — Encounter: Payer: BC Managed Care – PPO | Admitting: Obstetrics and Gynecology

## 2019-02-20 ENCOUNTER — Other Ambulatory Visit: Payer: Self-pay

## 2019-02-20 DIAGNOSIS — Z20822 Contact with and (suspected) exposure to covid-19: Secondary | ICD-10-CM

## 2019-02-21 LAB — NOVEL CORONAVIRUS, NAA: SARS-CoV-2, NAA: NOT DETECTED

## 2019-04-01 ENCOUNTER — Other Ambulatory Visit: Payer: Self-pay

## 2019-04-01 DIAGNOSIS — Z20822 Contact with and (suspected) exposure to covid-19: Secondary | ICD-10-CM

## 2019-04-03 LAB — NOVEL CORONAVIRUS, NAA: SARS-CoV-2, NAA: NOT DETECTED

## 2019-07-24 ENCOUNTER — Ambulatory Visit (INDEPENDENT_AMBULATORY_CARE_PROVIDER_SITE_OTHER): Payer: BC Managed Care – PPO | Admitting: Podiatry

## 2019-07-24 ENCOUNTER — Ambulatory Visit: Payer: BC Managed Care – PPO

## 2019-07-24 DIAGNOSIS — M79671 Pain in right foot: Secondary | ICD-10-CM

## 2019-07-26 ENCOUNTER — Ambulatory Visit: Payer: BC Managed Care – PPO | Attending: Internal Medicine

## 2019-07-26 ENCOUNTER — Other Ambulatory Visit: Payer: Self-pay

## 2019-07-26 DIAGNOSIS — Z23 Encounter for immunization: Secondary | ICD-10-CM | POA: Insufficient documentation

## 2019-07-26 NOTE — Progress Notes (Signed)
   Covid-19 Vaccination Clinic  Name:  ANANI GU    MRN: [id number] DOB: [birth year]  07/26/2019  Ms. Neiswonger was observed post Covid-19 immunization for 15 minutes without incident. She was provided with Vaccine Information Sheet and instruction to access the V-Safe system.   Ms. Holecek was instructed to call 911 with any severe reactions post vaccine: Marland Kitchen Difficulty breathing  . Swelling of face and throat  . A fast heartbeat  . A bad rash all over body  . Dizziness and weakness   Immunizations Administered    Name Date Dose VIS Date Route   Moderna COVID-19 Vaccine 07/26/2019  2:23 PM 0.5 mL 04/22/2019 Intramuscular   Manufacturer: Moderna   Lot: [id number]   NDC: 68341-962-22

## 2019-07-31 ENCOUNTER — Other Ambulatory Visit: Payer: Self-pay | Admitting: Obstetrics & Gynecology

## 2019-07-31 ENCOUNTER — Ambulatory Visit
Admission: RE | Admit: 2019-07-31 | Discharge: 2019-07-31 | Disposition: A | Discharge status: Home / Self Care | Payer: BC Managed Care – PPO | Source: Ambulatory Visit | Attending: Obstetrics & Gynecology | Admitting: Obstetrics & Gynecology

## 2019-07-31 ENCOUNTER — Other Ambulatory Visit: Payer: Self-pay

## 2019-07-31 DIAGNOSIS — N939 Abnormal uterine and vaginal bleeding, unspecified: Secondary | ICD-10-CM | POA: Diagnosis present

## 2019-08-04 ENCOUNTER — Other Ambulatory Visit: Payer: Self-pay | Admitting: Obstetrics & Gynecology

## 2019-08-06 ENCOUNTER — Other Ambulatory Visit: Payer: Self-pay

## 2019-08-06 ENCOUNTER — Encounter
Admission: RE | Admit: 2019-08-06 | Discharge: 2019-08-06 | Disposition: A | Discharge status: Home / Self Care | Payer: BC Managed Care – PPO | Source: Ambulatory Visit | Attending: Obstetrics & Gynecology | Admitting: Obstetrics & Gynecology

## 2019-08-06 HISTORY — DX: Anxiety disorder, unspecified: F41.9

## 2019-08-06 NOTE — Patient Instructions (Signed)
Your procedure is scheduled on: August 08, 2019 FRIDAY Report to Day Surgery on the 2nd floor of [facility]. To find out your arrival time, please call [phone] between 1PM - 3PM on: Thursday August 07, 2019  REMEMBER: Instructions that are not followed completely may result in serious medical risk, up to and including death; or upon the discretion of your surgeon and anesthesiologist your surgery may need to be rescheduled.  Do not eat food after midnight the night before surgery.  No gum chewing, lozengers or hard candies.  You may however, drink CLEAR liquids up to 2 hours before you are scheduled to arrive for your surgery. Do not drink anything within 2 hours of the start of your surgery.  Clear liquids include: - water  - apple juice without pulp - gatorade (not RED) - black coffee or tea (Do NOT add milk or creamers to the coffee or tea) Do NOT drink anything that is not on this list.  Type 1 and Type 2 diabetics should only drink water.  ENSURE PRE-SURGERY CARBOHYDRATE DRINK:  Complete drinking 2 hours prior to scheduled arrival time. IF MD ORDERS A DRINK   TAKE THESE MEDICATIONS THE MORNING OF SURGERY WITH A SIP OF WATER: NONE  Stop Metformin 2 days prior to surgery.  Stop Anti-inflammatories (NSAIDS) such as Advil, Aleve, Ibuprofen, Motrin, Naproxen, Naprosyn and Aspirin based products such as Excedrin, Goodys Powder, BC Powder. (May take Tylenol or Acetaminophen if needed.)  Stop ANY OVER THE COUNTER supplements until after surgery. (May continue Vitamin D, Vitamin B, and multivitamin.)  No Alcohol for 24 hours before or after surgery.  No Smoking including e-cigarettes for 24 hours prior to surgery.  No chewable tobacco products for at least 6 hours prior to surgery.  No nicotine patches on the day of surgery.  On the morning of surgery brush your teeth with toothpaste and water, you may rinse your mouth with mouthwash if you wish. Do not swallow any  toothpaste or mouthwash.  Do not wear jewelry, make-up, hairpins, clips or nail polish.  Do not wear lotions, powders, or perfumes.   Do not shave 48 hours prior to surgery.   Contact lenses, hearing aids and dentures may not be worn into surgery.  Do not bring valuables to the hospital, including drivers license, insurance or credit cards.  [facility] is not responsible for any belongings or valuables.   Use CHG Soap  as directed on instruction sheet.  Notify your doctor if there is any change in your medical condition (cold, fever, infection).  Wear comfortable clothing (specific to your surgery type) to the hospital.  Plan for stool softeners for home use.  If you are being discharged the day of surgery, you will not be allowed to drive home. You will need a responsible adult to drive you home and stay with you that night.   If you are taking public transportation, you will need to have a responsible adult with you. Please confirm with your physician that it is acceptable to use public transportation.   Please call [phone] if you have any questions about these instructions.  Visitation Policy:  Patients undergoing a surgery or procedure in a hospital may have one family member or support person with them as long as that person is not COVID-19 positive or experiencing its symptoms. That person may remain in the waiting area during the procedure. Should the patient need to stay at the hospital during part of their  recovery, the support person may visit during visiting hours; 10 am to 8 pm.  Children under 67 years of age may have both parents or legal guardians with them during their hospital stay.

## 2019-08-07 ENCOUNTER — Other Ambulatory Visit
Admission: RE | Admit: 2019-08-07 | Discharge: 2019-08-07 | Disposition: A | Discharge status: Home / Self Care | Payer: BC Managed Care – PPO | Source: Ambulatory Visit | Attending: Obstetrics & Gynecology | Admitting: Obstetrics & Gynecology

## 2019-08-07 DIAGNOSIS — Z01812 Encounter for preprocedural laboratory examination: Secondary | ICD-10-CM | POA: Diagnosis present

## 2019-08-07 DIAGNOSIS — Z20822 Contact with and (suspected) exposure to covid-19: Secondary | ICD-10-CM | POA: Insufficient documentation

## 2019-08-07 LAB — TYPE AND SCREEN
ABO/RH(D): O POS
Antibody Screen: NEGATIVE

## 2019-08-07 LAB — CBC
HCT: 38.6 % (ref 36.0–46.0)
Hemoglobin: 12.9 g/dL (ref 12.0–15.0)
MCH: 28.1 pg (ref 26.0–34.0)
MCHC: 33.4 g/dL (ref 30.0–36.0)
MCV: 84.1 fL (ref 80.0–100.0)
Platelets: 332 10*3/uL (ref 150–400)
RBC: 4.59 MIL/uL (ref 3.87–5.11)
RDW: 13.7 % (ref 11.5–15.5)
WBC: 8.6 10*3/uL (ref 4.0–10.5)
nRBC: 0 % (ref 0.0–0.2)

## 2019-08-07 LAB — BASIC METABOLIC PANEL
Anion gap: 10 (ref 5–15)
BUN: 12 mg/dL (ref 6–20)
CO2: 24 mmol/L (ref 22–32)
Calcium: 9.4 mg/dL (ref 8.9–10.3)
Chloride: 103 mmol/L (ref 98–111)
Creatinine, Ser: 0.57 mg/dL (ref 0.44–1.00)
GFR calc Af Amer: >60 mL/min (ref >60)
GFR calc non Af Amer: >60 mL/min (ref >60)
Glucose, Bld: 90 mg/dL (ref 70–99)
Potassium: 4.1 mmol/L (ref 3.5–5.1)
Sodium: 137 mmol/L (ref 135–145)

## 2019-08-07 LAB — SARS CORONAVIRUS 2 (TAT 6-24 HRS): SARS Coronavirus 2: NEGATIVE

## 2019-08-07 MED ORDER — DEXTROSE 5 % IV SOLN
3.0000 g | INTRAVENOUS | Status: AC
  Administered 2019-08-08: 14:00:00 3 g via INTRAVENOUS
  Filled 2019-08-07: qty 3

## 2019-08-08 ENCOUNTER — Encounter
Admission: RE | Disposition: A | Discharge status: Home / Self Care | Payer: Self-pay | Source: Home / Self Care | Attending: Obstetrics & Gynecology

## 2019-08-08 ENCOUNTER — Ambulatory Visit
Admission: RE | Admit: 2019-08-08 | Discharge: 2019-08-08 | Disposition: A | Discharge status: Home / Self Care | Payer: BC Managed Care – PPO | Attending: Obstetrics & Gynecology | Admitting: Obstetrics & Gynecology

## 2019-08-08 ENCOUNTER — Ambulatory Visit: Payer: BC Managed Care – PPO | Admitting: Certified Registered Nurse Anesthetist

## 2019-08-08 ENCOUNTER — Other Ambulatory Visit: Payer: Self-pay

## 2019-08-08 ENCOUNTER — Encounter: Payer: Self-pay | Admitting: Obstetrics & Gynecology

## 2019-08-08 DIAGNOSIS — Z6841 Body Mass Index (BMI) 40.0 and over, adult: Secondary | ICD-10-CM | POA: Insufficient documentation

## 2019-08-08 DIAGNOSIS — N83292 Other ovarian cyst, left side: Secondary | ICD-10-CM | POA: Diagnosis not present

## 2019-08-08 DIAGNOSIS — Z7982 Long term (current) use of aspirin: Secondary | ICD-10-CM | POA: Insufficient documentation

## 2019-08-08 DIAGNOSIS — N736 Female pelvic peritoneal adhesions (postinfective): Secondary | ICD-10-CM | POA: Insufficient documentation

## 2019-08-08 DIAGNOSIS — Z7984 Long term (current) use of oral hypoglycemic drugs: Secondary | ICD-10-CM | POA: Insufficient documentation

## 2019-08-08 DIAGNOSIS — N8502 Endometrial intraepithelial neoplasia [EIN]: Secondary | ICD-10-CM | POA: Diagnosis not present

## 2019-08-08 DIAGNOSIS — Z90721 Acquired absence of ovaries, unilateral: Secondary | ICD-10-CM | POA: Diagnosis not present

## 2019-08-08 DIAGNOSIS — N920 Excessive and frequent menstruation with regular cycle: Secondary | ICD-10-CM | POA: Diagnosis not present

## 2019-08-08 DIAGNOSIS — Z793 Long term (current) use of hormonal contraceptives: Secondary | ICD-10-CM | POA: Diagnosis not present

## 2019-08-08 DIAGNOSIS — R9389 Abnormal findings on diagnostic imaging of other specified body structures: Secondary | ICD-10-CM | POA: Diagnosis not present

## 2019-08-08 DIAGNOSIS — N979 Female infertility, unspecified: Secondary | ICD-10-CM | POA: Insufficient documentation

## 2019-08-08 HISTORY — PX: LAPAROSCOPIC OVARIAN CYSTECTOMY: SHX6248

## 2019-08-08 HISTORY — PX: HYSTEROSCOPY WITH D & C: SHX1775

## 2019-08-08 HISTORY — PX: CHROMOPERTUBATION: SHX6288

## 2019-08-08 LAB — POCT PREGNANCY, URINE: Preg Test, Ur: NEGATIVE

## 2019-08-08 SURGERY — DILATATION AND CURETTAGE /HYSTEROSCOPY
Anesthesia: General

## 2019-08-08 MED ORDER — KETOROLAC TROMETHAMINE 15 MG/ML IJ SOLN
INTRAMUSCULAR | Status: AC
  Administered 2019-08-08: 13:00:00 15 mg
  Filled 2019-08-08: qty 1

## 2019-08-08 MED ORDER — FENTANYL CITRATE (PF) 100 MCG/2ML IJ SOLN
INTRAMUSCULAR | Status: AC
  Filled 2019-08-08: qty 2

## 2019-08-08 MED ORDER — PROPOFOL 500 MG/50ML IV EMUL
INTRAVENOUS | Status: AC
  Filled 2019-08-08: qty 50

## 2019-08-08 MED ORDER — FENTANYL CITRATE (PF) 100 MCG/2ML IJ SOLN: INTRAMUSCULAR | Status: DC | PRN

## 2019-08-08 MED ORDER — SEVOFLURANE IN SOLN
RESPIRATORY_TRACT | Status: AC
  Filled 2019-08-08: qty 250

## 2019-08-08 MED ORDER — FENTANYL CITRATE (PF) 100 MCG/2ML IJ SOLN: 25.0000 ug | INTRAMUSCULAR | Status: DC | PRN

## 2019-08-08 MED ORDER — HEPARIN SODIUM (PORCINE) 5000 UNIT/ML IJ SOLN
INTRAMUSCULAR | Status: AC
  Administered 2019-08-08: 5000 [IU]
  Filled 2019-08-08: qty 1

## 2019-08-08 MED ORDER — LACTATED RINGERS IV SOLN: INTRAVENOUS | Status: DC

## 2019-08-08 MED ORDER — GABAPENTIN 300 MG PO CAPS
ORAL_CAPSULE | ORAL | Status: AC
  Administered 2019-08-08: 600 mg
  Filled 2019-08-08: qty 2

## 2019-08-08 MED ORDER — FENTANYL CITRATE (PF) 100 MCG/2ML IJ SOLN
INTRAMUSCULAR | Status: DC | PRN
  Administered 2019-08-08: 100 ug via INTRAVENOUS

## 2019-08-08 MED ORDER — KETOROLAC TROMETHAMINE 15 MG/ML IJ SOLN: 15.0000 mg | Freq: Four times a day (QID) | INTRAMUSCULAR | Status: DC

## 2019-08-08 MED ORDER — OXYCODONE HCL 5 MG PO TABS: 5.0000 mg | ORAL_TABLET | ORAL | 0 refills | Status: AC | PRN

## 2019-08-08 MED ORDER — HYDROMORPHONE HCL 1 MG/ML IJ SOLN
INTRAMUSCULAR | Status: DC | PRN
  Administered 2019-08-08: .4 mg via INTRAVENOUS
  Administered 2019-08-08: .6 mg via INTRAVENOUS

## 2019-08-08 MED ORDER — SCOPOLAMINE 1 MG/3DAYS TD PT72
MEDICATED_PATCH | TRANSDERMAL | Status: AC
  Administered 2019-08-08: 1.5 mg
  Filled 2019-08-08: qty 1

## 2019-08-08 MED ORDER — FAMOTIDINE 20 MG PO TABS
ORAL_TABLET | ORAL | Status: AC
  Administered 2019-08-08: 40 mg
  Filled 2019-08-08: qty 1

## 2019-08-08 MED ORDER — OXYCODONE HCL 5 MG PO TABS
5.0000 mg | ORAL_TABLET | Freq: Once | ORAL | Status: AC
  Administered 2019-08-08: 5 mg via ORAL

## 2019-08-08 MED ORDER — ROCURONIUM BROMIDE 100 MG/10ML IV SOLN
INTRAVENOUS | Status: DC | PRN
  Administered 2019-08-08: 10 mg via INTRAVENOUS
  Administered 2019-08-08: 30 mg via INTRAVENOUS
  Administered 2019-08-08: 20 mg via INTRAVENOUS

## 2019-08-08 MED ORDER — LIDOCAINE HCL (PF) 2 % IJ SOLN
INTRAMUSCULAR | Status: AC
  Filled 2019-08-08: qty 5

## 2019-08-08 MED ORDER — SUGAMMADEX SODIUM 500 MG/5ML IV SOLN
INTRAVENOUS | Status: DC | PRN
  Administered 2019-08-08: 200 mg via INTRAVENOUS

## 2019-08-08 MED ORDER — ROCURONIUM BROMIDE 10 MG/ML (PF) SYRINGE
PREFILLED_SYRINGE | INTRAVENOUS | Status: AC
  Filled 2019-08-08: qty 10

## 2019-08-08 MED ORDER — HYDROMORPHONE HCL 1 MG/ML IJ SOLN
INTRAMUSCULAR | Status: AC
  Filled 2019-08-08: qty 1

## 2019-08-08 MED ORDER — PROPOFOL 10 MG/ML IV BOLUS
INTRAVENOUS | Status: DC | PRN
  Administered 2019-08-08: 200 mg via INTRAVENOUS

## 2019-08-08 MED ORDER — LIDOCAINE HCL (CARDIAC) PF 100 MG/5ML IV SOSY
PREFILLED_SYRINGE | INTRAVENOUS | Status: DC | PRN
  Administered 2019-08-08: 100 mg via INTRAVENOUS

## 2019-08-08 MED ORDER — MIDAZOLAM HCL 2 MG/2ML IJ SOLN
INTRAMUSCULAR | Status: DC | PRN
  Administered 2019-08-08: 2 mg via INTRAVENOUS

## 2019-08-08 MED ORDER — IBUPROFEN 800 MG PO TABS: 800.0000 mg | ORAL_TABLET | Freq: Four times a day (QID) | ORAL | 1 refills | Status: DC

## 2019-08-08 MED ORDER — ONDANSETRON HCL 4 MG/2ML IJ SOLN
INTRAMUSCULAR | Status: AC
  Filled 2019-08-08: qty 2

## 2019-08-08 MED ORDER — METHYLENE BLUE 0.5 % INJ SOLN
INTRAVENOUS | Status: DC | PRN
  Administered 2019-08-08: 1 mL via SUBMUCOSAL

## 2019-08-08 MED ORDER — SUGAMMADEX SODIUM 500 MG/5ML IV SOLN
INTRAVENOUS | Status: AC
  Filled 2019-08-08: qty 5

## 2019-08-08 MED ORDER — MIDAZOLAM HCL 2 MG/2ML IJ SOLN
INTRAMUSCULAR | Status: AC
  Filled 2019-08-08: qty 2

## 2019-08-08 MED ORDER — SODIUM CHLORIDE (PF) 0.9 % IJ SOLN
INTRAMUSCULAR | Status: AC
  Filled 2019-08-08: qty 10

## 2019-08-08 MED ORDER — OXYCODONE HCL 5 MG PO TABS
ORAL_TABLET | ORAL | Status: AC
  Filled 2019-08-08: qty 1

## 2019-08-08 MED ORDER — ONDANSETRON HCL 4 MG/2ML IJ SOLN: 4.0000 mg | Freq: Once | INTRAMUSCULAR | Status: DC | PRN

## 2019-08-08 MED ORDER — KETOROLAC TROMETHAMINE 15 MG/ML IJ SOLN
INTRAMUSCULAR | Status: AC
  Administered 2019-08-08: 15 mg via INTRAVENOUS
  Filled 2019-08-08: qty 1

## 2019-08-08 MED ORDER — SUCCINYLCHOLINE CHLORIDE 200 MG/10ML IV SOSY
PREFILLED_SYRINGE | INTRAVENOUS | Status: AC
  Filled 2019-08-08: qty 10

## 2019-08-08 MED ORDER — DEXAMETHASONE SODIUM PHOSPHATE 10 MG/ML IJ SOLN
INTRAMUSCULAR | Status: DC | PRN
  Administered 2019-08-08: 10 mg via INTRAVENOUS

## 2019-08-08 MED ORDER — ACETAMINOPHEN 500 MG PO TABS: 1000.0000 mg | ORAL_TABLET | Freq: Four times a day (QID) | ORAL | Status: DC

## 2019-08-08 MED ORDER — DEXMEDETOMIDINE HCL IN NACL 80 MCG/20ML IV SOLN
INTRAVENOUS | Status: AC
  Filled 2019-08-08: qty 20

## 2019-08-08 MED ORDER — ACETAMINOPHEN 500 MG PO TABS
ORAL_TABLET | ORAL | Status: AC
  Administered 2019-08-08: 1000 mg
  Filled 2019-08-08: qty 2

## 2019-08-08 MED ORDER — FAMOTIDINE 20 MG PO TABS: 20.0000 mg | ORAL_TABLET | Freq: Once | ORAL | Status: DC

## 2019-08-08 MED ORDER — DEXAMETHASONE SODIUM PHOSPHATE 10 MG/ML IJ SOLN
INTRAMUSCULAR | Status: AC
  Administered 2019-08-08: 4 mg
  Filled 2019-08-08: qty 1

## 2019-08-08 MED ORDER — LACTATED RINGERS IV SOLN: INTRAVENOUS | Status: DC | PRN

## 2019-08-08 SURGICAL SUPPLY — 60 items
BAG URINE DRAIN 2000ML AR STRL (UROLOGICAL SUPPLIES) ×3 IMPLANT
BLADE SURG SZ11 CARB STEEL (BLADE) ×3 IMPLANT
CANISTER SUCT 1200ML W/VALVE (MISCELLANEOUS) ×3 IMPLANT
CATH FOLEY 2WAY  5CC 16FR (CATHETERS) ×2
CATH URTH 16FR FL 2W BLN LF (CATHETERS) ×1 IMPLANT
CHLORAPREP W/TINT 26 (MISCELLANEOUS) ×3 IMPLANT
COVER WAND RF STERILE (DRAPES) ×3 IMPLANT
DERMABOND ADVANCED (GAUZE/BANDAGES/DRESSINGS) ×4
DERMABOND ADVANCED .7 DNX12 (GAUZE/BANDAGES/DRESSINGS) IMPLANT
DEVICE MYOSURE LITE (MISCELLANEOUS) IMPLANT
DEVICE MYOSURE REACH (MISCELLANEOUS) IMPLANT
DRAPE LEGGINS SURG 28X43 STRL (DRAPES) ×3 IMPLANT
DRAPE UNDER BUTTOCK W/FLU (DRAPES) ×3 IMPLANT
DRSG TELFA 4X3 1S NADH ST (GAUZE/BANDAGES/DRESSINGS) ×2 IMPLANT
ELECT REM PT RETURN 9FT ADLT (ELECTROSURGICAL) ×3
ELECTRODE REM PT RTRN 9FT ADLT (ELECTROSURGICAL) ×1 IMPLANT
GAUZE 4X4 16PLY RFD (DISPOSABLE) ×2 IMPLANT
GLOVE PI ORTHOPRO 6.5 (GLOVE) ×2
GLOVE PI ORTHOPRO STRL 6.5 (GLOVE) ×1 IMPLANT
GLOVE SURG SYN 6.5 ES PF (GLOVE) ×6 IMPLANT
GLOVE SURG SYN 6.5 PF PI (GLOVE) ×2 IMPLANT
GOWN STRL REUS W/ TWL LRG LVL3 (GOWN DISPOSABLE) ×2 IMPLANT
GOWN STRL REUS W/ TWL XL LVL3 (GOWN DISPOSABLE) ×1 IMPLANT
GOWN STRL REUS W/TWL LRG LVL3 (GOWN DISPOSABLE) ×4
GOWN STRL REUS W/TWL XL LVL3 (GOWN DISPOSABLE) ×4
GRASPER SUT TROCAR 14GX15 (MISCELLANEOUS) IMPLANT
IRRIGATION STRYKERFLOW (MISCELLANEOUS) IMPLANT
IRRIGATOR STRYKERFLOW (MISCELLANEOUS)
IV LACTATED RINGERS 1000ML (IV SOLUTION) ×3 IMPLANT
KIT PINK PAD W/HEAD ARE REST (MISCELLANEOUS) ×3
KIT PINK PAD W/HEAD ARM REST (MISCELLANEOUS) ×1 IMPLANT
KIT PROCEDURE FLUENT (KITS) IMPLANT
KIT TURNOVER CYSTO (KITS) ×3 IMPLANT
L-HOOK LAP DISP 36CM (ELECTROSURGICAL)
LABEL OR SOLS (LABEL) IMPLANT
LHOOK LAP DISP 36CM (ELECTROSURGICAL) IMPLANT
LIGASURE VESSEL 5MM BLUNT TIP (ELECTROSURGICAL) IMPLANT
MANIPULATOR UTERINE 4.5 ZUMI (MISCELLANEOUS) IMPLANT
NS IRRIG 500ML POUR BTL (IV SOLUTION) ×3 IMPLANT
PACK DNC HYST (MISCELLANEOUS) ×1 IMPLANT
PACK LAP CHOLECYSTECTOMY (MISCELLANEOUS) ×3 IMPLANT
PAD OB MATERNITY 4.3X12.25 (PERSONAL CARE ITEMS) ×3 IMPLANT
PAD PREP 24X41 OB/GYN DISP (PERSONAL CARE ITEMS) ×3 IMPLANT
PENCIL ELECTRO HAND CTR (MISCELLANEOUS) ×2 IMPLANT
POUCH SPECIMEN RETRIEVAL 10MM (ENDOMECHANICALS) IMPLANT
SEAL ROD LENS SCOPE MYOSURE (ABLATOR) ×3 IMPLANT
SET CYSTO W/LG BORE CLAMP LF (SET/KITS/TRAYS/PACK) ×2 IMPLANT
SET TUBE SMOKE EVAC HIGH FLOW (TUBING) ×3 IMPLANT
SLEEVE ENDOPATH XCEL 5M (ENDOMECHANICALS) ×6 IMPLANT
SUT MNCRL 4-0 (SUTURE) ×2
SUT MNCRL 4-0 27XMFL (SUTURE) ×1
SUT MNCRL AB 4-0 PS2 18 (SUTURE) ×3 IMPLANT
SUT VICRYL 0 AB UR-6 (SUTURE) ×2 IMPLANT
SUTURE MNCRL 4-0 27XMF (SUTURE) ×1 IMPLANT
SYR 50ML LL SCALE MARK (SYRINGE) ×2 IMPLANT
TROCAR ENDO BLADELESS 11MM (ENDOMECHANICALS) IMPLANT
TROCAR XCEL NON-BLD 5MMX100MML (ENDOMECHANICALS) ×3 IMPLANT
TUBING ART PRESS 48 MALE/FEM (TUBING) ×2 IMPLANT
TUBING CONNECTING 10 (TUBING) ×1 IMPLANT
TUBING CONNECTING 10' (TUBING)

## 2019-08-08 NOTE — H&P (Signed)
Preoperative History and Physical  Pamela Warren is a 29 y.o. G0P0000 here for surgical management of ovarian cyst, thickened endometrium and menorrhagia, and fertility preservation.  Patient is s/p right salpingo-oophorectomy and has a mass on her left adnexa. She also has thickened endometrium with history of complex endometrial hyperplasia, and heavy menstrual bleeding.   No significant preoperative concerns.  Proposed surgery: robotic-assisted laparoscopic cystectomy, chromopertubation, and dilation and curettage, hysteroscopy.  Past Medical History:  Diagnosis Date  . Anxiety   . Complex endometrial hyperplasia    right  . Heavy menstrual bleeding   . Infertility, female   . Ovarian cyst   . Ovarian cyst   . PCOS (polycystic ovarian syndrome)    Past Surgical History:  Procedure Laterality Date  . APPENDECTOMY    . DILATION AND CURETTAGE OF UTERUS     2015- cyst in tube -Dr Binnie Rail  . HYSTEROSCOPY WITH D & C N/A 10/05/2017   Procedure: DILATATION AND CURETTAGE /HYSTEROSCOPY;  Surgeon: Defrancesco, Prentice Docker, MD;  Location: [facility] ORS;  Service: Gynecology;  Laterality: N/A;  . LAPAROSCOPIC APPENDECTOMY N/A 10/05/2017   Procedure: APPENDECTOMY LAPAROSCOPIC;  Surgeon: Herold Harms, MD;  Location: [facility];  Service: Gynecology;  Laterality: N/A;  . LAPAROSCOPIC LYSIS OF ADHESIONS N/A 10/05/2017   Procedure: LAPAROSCOPIC LYSIS OF ADHESIONS;  Surgeon: Herold Harms, MD;  Location: [facility];  Service: Gynecology;  Laterality: N/A;  . LAPAROSCOPIC OVARIAN CYSTECTOMY Right 10/05/2017   Procedure: LAPAROSCOPIC OVARIAN CYSTECTOMY;  Surgeon: Herold Harms, MD;  Location: [facility];  Service: Gynecology;  Laterality: Right;  . LAPAROSCOPIC SALPINGO OOPHERECTOMY Right 10/05/2017   Procedure: LAPAROSCOPIC SALPINGO OOPHORECTOMY;  Surgeon: Defrancesco, Prentice Docker, MD;  Location: [facility];  Service: Gynecology;  Laterality: Right;   OB History  Gravida Para Term Preterm AB  Living  0 0 0 0 0 0  SAB TAB Ectopic Multiple Live Births  0 0 0 0    Patient denies any other pertinent gynecologic issues.   No current facility-administered medications on file prior to encounter.   Current Outpatient Medications on File Prior to Encounter  Medication Sig Dispense Refill  . aspirin-acetaminophen-caffeine (EXCEDRIN MIGRAINE) 250-250-65 MG tablet Take 2 tablets by mouth every 6 (six) hours as needed for headache.    . cetirizine-pseudoephedrine (ZYRTEC-D) 5-120 MG tablet Take 1 tablet by mouth daily as needed for allergies.    . fluticasone (FLONASE) 50 MCG/ACT nasal spray Place 1 spray into both nostrils daily as needed for allergies or rhinitis.    Marland Kitchen FOLIC ACID PO Take 1 capsule by mouth daily.    . medroxyPROGESTERone (PROVERA) 10 MG tablet Take 20 mg by mouth 2 (two) times daily as needed (bleeding).     . metFORMIN (GLUCOPHAGE) 500 MG tablet Take one tablet by mouth daily (Patient taking differently: Take 500 mg by mouth in the morning and at bedtime. ) 60 tablet 1  . Multiple Vitamin (MULTIVITAMIN WITH MINERALS) TABS tablet Take 1 tablet by mouth daily.     Allergies  Allergen Reactions  . Other Anaphylaxis    Tree Nuts    Social History:   reports that she has never smoked. She has never used smokeless tobacco. She reports current alcohol use. She reports that she does not use drugs.  Family History  Problem Relation Age of Onset  . Breast cancer Maternal Grandmother   . Diabetes Maternal Grandmother   . Lung cancer Paternal Grandmother   . Lupus Paternal Grandmother   .  Diabetes Mother   . Colon cancer Other   . Cervical cancer Other   . Heart disease Neg Hx     Review of Systems: Noncontributory  PHYSICAL EXAM: Blood pressure 115/73, pulse 100, temperature 97.8 F (36.6 C), temperature source Temporal, resp. rate 20, height 5\' 4"  (1.626 m), weight 109.8 kg, last menstrual period 08/05/2019, SpO2 95 %. General appearance - alert, well appearing,  and in no distress Chest - clear to auscultation, no wheezes, rales or rhonchi, symmetric air entry Heart - normal rate and regular rhythm Abdomen - soft, nontender, nondistended, no masses or organomegaly Pelvic - examination not indicated Extremities - peripheral pulses normal, no pedal edema, no clubbing or cyanosis  Labs: Results for orders placed or performed during the hospital encounter of 08/08/19 (from the past 336 hour(s))  Pregnancy, urine POC   Collection Time: 08/08/19  1:04 PM  Result Value Ref Range   Preg Test, Ur NEGATIVE NEGATIVE  Results for orders placed or performed during the hospital encounter of 08/07/19 (from the past 336 hour(s))  Basic metabolic panel   Collection Time: 08/07/19  8:22 AM  Result Value Ref Range   Sodium 137 135 - 145 mmol/L   Potassium 4.1 3.5 - 5.1 mmol/L   Chloride 103 98 - 111 mmol/L   CO2 24 22 - 32 mmol/L   Glucose, Bld 90 70 - 99 mg/dL   BUN 12 6 - 20 mg/dL   Creatinine, Ser 08/09/19 0.44 - 1.00 mg/dL   Calcium 9.4 8.9 - 1.61 mg/dL   GFR calc non Af Amer >60 >60 mL/min   GFR calc Af Amer >60 >60 mL/min   Anion gap 10 5 - 15  CBC   Collection Time: 08/07/19  8:22 AM  Result Value Ref Range   WBC 8.6 4.0 - 10.5 K/uL   RBC 4.59 3.87 - 5.11 MIL/uL   Hemoglobin 12.9 12.0 - 15.0 g/dL   HCT 08/09/19 04.5 - 40.9 %   MCV 84.1 80.0 - 100.0 fL   MCH 28.1 26.0 - 34.0 pg   MCHC 33.4 30.0 - 36.0 g/dL   RDW 81.1 91.4 - 78.2 %   Platelets 332 150 - 400 K/uL   nRBC 0.0 0.0 - 0.2 %  Type and screen [facility]   Collection Time: 08/07/19  8:22 AM  Result Value Ref Range   ABO/RH(D) O POS    Antibody Screen NEG    Sample Expiration 08/21/2019,2359    Extend sample reason      NO TRANSFUSIONS OR PREGNANCY IN THE PAST 3 MONTHS Performed at [facility], [address] [place], [place]   SARS CORONAVIRUS 2 (TAT 6-24 HRS) Nasopharyngeal Nasopharyngeal Swab   Collection Time: 08/07/19  8:54 AM   Specimen:  Nasopharyngeal Swab  Result Value Ref Range   SARS Coronavirus 2 NEGATIVE NEGATIVE    Imaging Studies: 08/09/19 PELVIC COMPLETE WITH TRANSVAGINAL  Result Date: 07/31/2019 CLINICAL DATA:  Vaginal bleeding for 11 days, status post right oophorectomy, history of known endometrial hyperplasia EXAM: TRANSABDOMINAL AND TRANSVAGINAL ULTRASOUND OF PELVIS DOPPLER ULTRASOUND OF OVARIES TECHNIQUE: Both transabdominal and transvaginal ultrasound examinations of the pelvis were performed. Transabdominal technique was performed for global imaging of the pelvis including uterus, ovaries, adnexal regions, and pelvic cul-de-sac. It was necessary to proceed with endovaginal exam following the transabdominal exam to visualize the left ovary. Color and duplex Doppler ultrasound was utilized to evaluate blood flow to the ovaries. COMPARISON:  10/01/2017 FINDINGS:  Uterus Measurements: 7.8 x 4.3 x 5.4 cm = volume: 97 mL. No fibroids or other mass visualized. Endometrium Thickness: 17 mm. There is a thickened, heterogeneous and slightly echogenic appearing endometrium, as seen on prior examination. Right ovary Surgically absent. Left ovary Measurements: 3.8 x 3.6 x 3.1 cm = volume: 22 mL. There is a left ovarian cyst measuring up to 3.5 cm which appears to have internal debris or nodularity, new compared to prior ultrasound. Pulsed Doppler evaluation of the left ovary demonstrates normal low-resistance arterial and venous waveforms. Other findings No abnormal free fluid. IMPRESSION: 1. There is a thickened, heterogeneous, and slightly echogenic appearing endometrium, similar to that of prior examination and in keeping with reported history of endometrial hyperplasia. Thickness on current examination is 17 mm. 2. There is a left ovarian cyst measuring up to 3.5 cm which appears to have internal debris or nodularity, new compared to prior ultrasound. This is likely a hemorrhagic cyst or follicle given patient age. Recommend follow-up  examination in 6 weeks to establish stability or resolution. 3.  Status post right oophorectomy. Electronically Signed   By: Eddie Candle M.D.   On: 07/31/2019 14:59    Assessment: Patient Active Problem List   Diagnosis Date Noted  . Status post laparoscopy with RSO/appendectomy/adhesiolysis 10/11/2017  . Status post D&C/hysteroscopy 10/11/2017  . Menorrhagia with irregular cycle 09/17/2017  . 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 Complex endometrial hyperplasia with atypia 09/17/2017  . Irregular periods/menstrual cycles 08/25/2015  . Morbid obesity with BMI of 40.0-44.9, adult ([place]) 08/25/2015  . Infertility, female 08/25/2015    Plan: Patient will undergo surgical management with robotic-assisted ovarian cystectomy, chromopertubation, lysis of adhesions, and dilation and curettage, hysteroscopy.   The risks of surgery were discussed in detail with the patient including but not limited to: bleeding which may require transfusion or reoperation; infection which may require antibiotics; injury to surrounding organs which may involve bowel, bladder, ureters ; need for additional procedures including laparoscopy or laparotomy; thromboembolic phenomenon, surgical site problems and other postoperative/anesthesia complications. Likelihood of success in alleviating the patient's condition was discussed. Routine postoperative instructions will be reviewed with the patient and her family in detail after surgery.  The patient concurred with the proposed plan, giving informed written consent for the surgery.  Patient has been NPO since last night she will remain NPO for procedure.  Anesthesia and OR aware.  Preoperative prophylactic antibiotics and SCDs ordered on call to the OR.  To OR when ready.  ----- Larey Days, MD, [place] Attending Obstetrician and Gynecologist [facility], Department of [facility]  08/08/2019 1:17 PM

## 2019-08-08 NOTE — Op Note (Signed)
Pamela Warren PROCEDURE DATE: 08/08/2019  PATIENT:  Pamela Warren  29 y.o. female  PRE-OPERATIVE DIAGNOSIS:  thickened endometrium, ovarian cyst, single fallopian tube  POST-OPERATIVE DIAGNOSIS:  thickened endometrium, ovarian cyst, single fallopian tube  PROCEDURE:  Procedure(s): DILATATION AND CURETTAGE /HYSTEROSCOPY (N/A) LAPAROSCOPIC OVARIAN CYSTECTOMY (N/A) CHROMOPERTUBATION (N/A)  LYSIS OF ADHESIONS ENTEROLYSIS  SURGEON:  Surgeon(s) and Role:    * Kazuko Clemence, Elenora Fender, MD - Primary    * Christeen Douglas, MD - Assisting  ANESTHESIA:  General via ET  I/O  Total I/O In: 1050 [I.V.:1000; IV Piggyback:50] Out: 120 [Urine:100; Blood:20]  FINDINGS:  1. Normal upper abdomen. 2. Adhesions of the omentum to LLQ port site, RLQ pelvic brim, pelvic sidewall, uterus, and right broad ligament. 3. Absent right tube and ovary 4. Pelvic adhesive disease 5. Sigmoid colon adherent to RIGHT pelvic side wall, LEFT pelvic side wall, and terminal ileum. 6. Cecum and terminal ileum adherent to pelvic brim and right pelvic sidewall 7. Normal appearing LEFT fallopian tube and ovary.  Ovary with 2 small simple cysts within.  8. Patent left fallopian tube 9. Endometrium thick and fluffy with tendrils of tissue within. Copious tissue extracted.   SPECIMEN:  1. Left ovarian cyst wall 2. Endometrial curettings  COMPLICATIONS: none apparent  DISPOSITION: vital signs stable to PACU   Indication for Surgery: 29 y.o. G0P0000 with a history of right salpingo-oophorectomy, and complex hyperplasia of the endometrium presents today with menometrorrhagia, ovarian cyst, pelvic pain, and infertility.   Risks of surgery were discussed with the patient including but not limited to: bleeding which may require transfusion or reoperation; infection which may require antibiotics; injury to bowel, bladder, ureters or other surrounding organs; need for additional procedures including laparotomy, blood clot,  incisional problems and other postoperative/anesthesia complications. Written informed consent was obtained.    PROCEDURE IN DETAIL:  The patient had sequential compression devices applied to her lower extremities while in the preoperative area.  She was then taken to the operating room where general anesthesia was administered and was found to be adequate.  She was placed in the dorsal lithotomy position, and was prepped and draped in a sterile manner.  A Foley catheter was inserted into her bladder and attached to constant drainage and a uterine manipulator was then advanced into the uterus .  After a surgical timeout was performed, attention was turned to the abdomen where an umbilical incision was made with the scalpel. A 63mm trochar was inserted in the umbilical incision using a visiport method. Opening pressure was , and the abdomen was insufflated to 15mg Hg carbon dioxide gas and adequate pneumoperitoneum was obtained.  A survey of the patient's pelvis and abdomen revealed the findings as mentioned above. Two ports were inserted in the lower left and right quadrants under visualization.    The omentum adhesions were addressed first, and all dissections were made with bovie cautery.  The omentum was inspected to ensure no bowel involvement, and was tented off of the external iliac peritoneum and carefully dissected off of the side wall.  The adhesions extended into the pelvis, and on the broad ligament.  The cecum was then retracted superiorly and the adhesions were dissected off of the peritoneum and pelvic brim the terminal ileum was identified and adherent to the sigmoid colon, and this was carefully separated bluntly, and occasionally with cautery.  Then the sigmoid was tented medially and the adhesions along the right pelvic sidewall were transected.  Then the left portion of the  sigmoid which was adherent to the left pelvic sidewall was freed from these adhesions to the paracolic gutter.  The  sigmoid was attached to the ovarian fossa on the left, the ovary was lifted and the epiploica dissected off of the ovary.  Once this was able to be brought out of the exterior pelvis, the adhesions were able to be dissected without difficulty. After freeing the sigmoid from its adhesions, the attention was turned to the left ovary and fallopian tube which were no longer adherent to the sigmoid colon.  Chromopertubation was performed by injecting methylene blue dyed saline through the uterine manipulator.  There was free spillage from the fimbriated end of the tube.   There were 2 small simple cysts on the ovary, they were opened with cautery and the cystic walls were peeled off of the ovary.  Cautery was used minimally for hemostasis, and fibular was placed into each bed.  Ovary was hemostatic.  The tube and ovary were placed together beneath the uterus in an anatomically normal place. The operative site was surveyed, and it was found to be hemostatic.  No intraoperative injury to surrounding organs was noted; the laparoscopic portion of this procedure was deemed complete.  The inlet closure device was used to close the 11 mm port site on the left.  The trochars were removed, pneumoperitoneum deflated, and skin incisions closed with 4-0 Monocryl.  They were then covered in glue. The attention was then turned to the vagina, where a speculum was placed, the uterine manipulator removed, and a tenaculum placed at 12:00.  Sequential dilators were used in order to accommodate the myoscope.  This was inserted and though visibility was difficult, it was clear that there was an abundant tissue within the endometrial cavity.  The camera was removed and sharp curettage was performed until there was a gritty cry in all four quadrants. Copious tissue was removed.  The camera was reinserted and confirmed satisfactory evacuation of the uterine contents.  The camera and tenaculum were removed and silver nitrate was used for  bleeding points.  Once hemostatic, the speculum and foley catheter were removed.   The patient tolerated the procedures well.  All instruments, needles, and sponge counts were correct x 2. The patient was taken to the recovery room in stable condition.   ---- Larey Days, MD Attending Obstetrician and [facility]

## 2019-08-08 NOTE — Discharge Instructions (Signed)
AMBULATORY SURGERY  DISCHARGE INSTRUCTIONS   1) The drugs that you were given will stay in your system until tomorrow so for the next 24 hours you should not:  A) Drive an automobile B) Make any legal decisions C) Drink any alcoholic beverage   2) You may resume regular meals tomorrow.  Today it is better to start with liquids and gradually work up to solid foods.  You may eat anything you prefer, but it is better to start with liquids, then soup and crackers, and gradually work up to solid foods.   3) Please notify your doctor immediately if you have any unusual bleeding, trouble breathing, redness and pain at the surgery site, drainage, fever, or pain not relieved by medication.    4) Additional Instructions:        Please contact your physician with any problems or Same Day Surgery at [phone], Monday through Friday 6 am to 4 pm, or [place] at [facility] number at [phone].AMBULATORY SURGERY  DISCHARGE INSTRUCTIONS   5) The drugs that you were given will stay in your system until tomorrow so for the next 24 hours you should not:  D) Drive an automobile E) Make any legal decisions F) Drink any alcoholic beverage   6) You may resume regular meals tomorrow.  Today it is better to start with liquids and gradually work up to solid foods.  You may eat anything you prefer, but it is better to start with liquids, then soup and crackers, and gradually work up to solid foods.   7) Please notify your doctor immediately if you have any unusual bleeding, trouble breathing, redness and pain at the surgery site, drainage, fever, or pain not relieved by medication.    8) Additional Instructions:        Please contact your physician with any problems or Same Day Surgery at [phone], Monday through Friday 6 am to 4 pm, or [place] at [facility] number at [phone].AMBULATORY SURGERY  DISCHARGE INSTRUCTIONS   9) The drugs that you were given  will stay in your system until tomorrow so for the next 24 hours you should not:  G) Drive an automobile H) Make any legal decisions I) Drink any alcoholic beverage   10) You may resume regular meals tomorrow.  Today it is better to start with liquids and gradually work up to solid foods.  You may eat anything you prefer, but it is better to start with liquids, then soup and crackers, and gradually work up to solid foods.   11) Please notify your doctor immediately if you have any unusual bleeding, trouble breathing, redness and pain at the surgery site, drainage, fever, or pain not relieved by medication.    12) Additional Instructions:        Please contact your physician with any problems or Same Day Surgery at [phone], Monday through Friday 6 am to 4 pm, or [place] at [facility] number at [phone].AMBULATORY SURGERY  DISCHARGE INSTRUCTIONS   13) The drugs that you were given will stay in your system until tomorrow so for the next 24 hours you should not:  J) Drive an automobile K) Make any legal decisions L) Drink any alcoholic beverage   14) You may resume regular meals tomorrow.  Today it is better to start with liquids and gradually work up to solid foods.  You may eat anything you prefer, but it is better to start with liquids, then soup and crackers, and gradually work  up to solid foods.   15) Please notify your doctor immediately if you have any unusual bleeding, trouble breathing, redness and pain at the surgery site, drainage, fever, or pain not relieved by medication.    16) Additional Instructions:        Please contact your physician with any problems or Same Day Surgery at [phone], Monday through Friday 6 am to 4 pm, or [place] at [facility] number at [phone].Discharge instructions:  Call office if you have any of the following: fever >101 F, chills, shortness of breath, excessive vaginal bleeding, incision drainage  or problems, leg pain or redness, or any other concerns.   Activity: Do not lift > 20 lbs for 4 weeks.  Nothing in the vagina for 2 weeks No driving until you are certain you can slam on the brakes, and of course never while taking narcotics.   You may feel some pain in your upper right abdomen/rib and right shoulder.  This is from the gas in the abdomen for surgery. This will subside over time, please be patient!  Take 800mg  Ibuprofen and 1000mg  Tylenol together, around the clock, every 6 hours for at least the first 3-5 days.  After this you can take as needed.  This will help decrease inflammation and promote healing.  The narcotics you'll take just as needed, as they just trick your brain into thinking its not in pain.    Please don't limit yourself in terms of routine activity.  You will be able to do most things, although they may take longer to do or be a little painful.  You can do it!  Don't be a hero, but don't be a wimp either!

## 2019-08-08 NOTE — Anesthesia Preprocedure Evaluation (Signed)
Anesthesia Evaluation  Patient identified by MRN, date of birth, ID band Patient awake    Reviewed: Allergy & Precautions, NPO status , Patient's Chart, lab work & pertinent test results, reviewed documented beta blocker date and time   Airway Mallampati: III  TM Distance: >3 FB     Dental  (+) Chipped   Pulmonary neg pulmonary ROS,    Pulmonary exam normal        Cardiovascular negative cardio ROS Normal cardiovascular exam     Neuro/Psych Anxiety negative neurological ROS     GI/Hepatic negative GI ROS, Neg liver ROS,   Endo/Other  Morbid obesity  Renal/GU negative Renal ROS  Female GU complaint     Musculoskeletal negative musculoskeletal ROS (+)   Abdominal Normal abdominal exam  (+)   Peds negative pediatric ROS (+)  Hematology negative hematology ROS (+)   Anesthesia Other Findings Past Medical History: No date: Anxiety No date: Complex endometrial hyperplasia     Comment:  right No date: Heavy menstrual bleeding No date: Infertility, female No date: Ovarian cyst No date: Ovarian cyst No date: PCOS (polycystic ovarian syndrome)  Reproductive/Obstetrics                             Anesthesia Physical  Anesthesia Plan  ASA: III  Anesthesia Plan: General   Post-op Pain Management:    Induction: Intravenous, Rapid sequence and Cricoid pressure planned  PONV Risk Score and Plan:   Airway Management Planned: Oral ETT  Additional Equipment:   Intra-op Plan:   Post-operative Plan:   Informed Consent: I have reviewed the patients History and Physical, chart, labs and discussed the procedure including the risks, benefits and alternatives for the proposed anesthesia with the patient or authorized representative who has indicated his/her understanding and acceptance.       Plan Discussed with: CRNA  Anesthesia Plan Comments:         Anesthesia Quick  Evaluation

## 2019-08-08 NOTE — Anesthesia Postprocedure Evaluation (Signed)
Anesthesia Post Note  Patient: Pamela Warren  Procedure(s) Performed: DILATATION AND CURETTAGE /HYSTEROSCOPY (N/A ) LAPAROSCOPIC OVARIAN CYSTECTOMY (N/A ) CHROMOPERTUBATION (N/A )  Patient location during evaluation: PACU Anesthesia Type: General Level of consciousness: awake and alert Pain management: pain level controlled Vital Signs Assessment: post-procedure vital signs reviewed and stable Respiratory status: spontaneous breathing, nonlabored ventilation, respiratory function stable and patient connected to nasal cannula oxygen Cardiovascular status: blood pressure returned to baseline and stable Postop Assessment: no apparent nausea or vomiting Anesthetic complications: no     Last Vitals:  Vitals:   08/08/19 1755 08/08/19 1800  BP:  116/64  Pulse: 87 80  Resp:  18  Temp:  36.4 C  SpO2: 99%     Last Pain:  Vitals:   08/08/19 1800  TempSrc: Tympanic  PainSc: 4                  Fusako Tanabe K Jhan Conery

## 2019-08-08 NOTE — Transfer of Care (Signed)
Immediate Anesthesia Transfer of Care Note  Patient: Pamela Warren  Procedure(s) Performed: DILATATION AND CURETTAGE /HYSTEROSCOPY (N/A ) LAPAROSCOPIC OVARIAN CYSTECTOMY (N/A ) CHROMOPERTUBATION (N/A )  Patient Location: PACU  Anesthesia Type:General  Level of Consciousness: sedated  Airway & Oxygen Therapy: Patient Spontanous Breathing and Patient connected to face mask oxygen  Post-op Assessment: Report given to RN and Post -op Vital signs reviewed and stable  Post vital signs: Reviewed and stable  Last Vitals:  Vitals Value Taken Time  BP    Temp    Pulse 78 08/08/19 1705  Resp 22 08/08/19 1705  SpO2 100 % 08/08/19 1705  Vitals shown include unvalidated device data.  Last Pain:  Vitals:   08/08/19 1248  TempSrc: Temporal  PainSc: 0-No pain         Complications: No apparent anesthesia complications

## 2019-08-08 NOTE — Anesthesia Procedure Notes (Signed)
Procedure Name: Intubation Date/Time: 08/08/2019 1:58 PM Performed by: Almeta Monas, CRNA Pre-anesthesia Checklist: Patient identified, Patient being monitored, Timeout performed, Emergency Drugs available and Suction available Patient Re-evaluated:Patient Re-evaluated prior to induction Oxygen Delivery Method: Circle system utilized Preoxygenation: Pre-oxygenation with 100% oxygen Induction Type: IV induction and Cricoid Pressure applied Ventilation: Mask ventilation without difficulty Laryngoscope Size: 3 and McGraph Grade View: Grade II Tube type: Oral Tube size: 7.0 mm Number of attempts: 1 Airway Equipment and Method: Stylet Placement Confirmation: ETT inserted through vocal cords under direct vision,  positive ETCO2 and breath sounds checked- equal and bilateral Secured at: 21 cm Tube secured with: Tape Dental Injury: Teeth and Oropharynx as per pre-operative assessment

## 2019-08-12 LAB — SURGICAL PATHOLOGY

## 2019-09-03 ENCOUNTER — Ambulatory Visit: Payer: BC Managed Care – PPO

## 2020-02-23 ENCOUNTER — Other Ambulatory Visit: Payer: Self-pay | Admitting: Obstetrics & Gynecology

## 2020-02-23 NOTE — Progress Notes (Signed)
Chief Complaint:      Chief Complaint  Patient presents with  . Menorrhagia    HPI:  Pamela Warren is a 29 y.o. female here for Menorrhagia  She is an established patient who presents today for a recurrent problem:              Location:  Uterus             Quality:  Bleeding             Severity:  Moderate - Severe            Duration:  2 weeks             Timing:  Constant             Modifying factors: provera makes better, nothing makes worse            Associated signs and symptoms:  +clotting, +pelvic pain, +dysuria, no fever or chills             Context:    2 weeks heavy bleeding with clots and pelvic pain  Seen yesterday at [facility] ED, ultrasound done.  UA was remarkable with concern for UTI. Given Keflex for acute cystitis and hematuria and provera to stop uterine bleeding   Has history of of complex hyperplasia with atypia dx'd several years ago, s/p progestin treatment.   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   Left tube and ovary present, left tube patent; patient strongly desires future fertility.  Gyn Onc recommended 39mos progestin and rebiopsy in 45mos It is now approximately 69mos since her diagnosis.    Yesterday @ ED - [facility]  02/19/2020 Ultrasound Uterus: 8.4 x 6.3 x 5.1 cm  Endometrium: 2.35 cm  Right ovary: length 2.2 cm; transverse 0.8 cm; AP 0.9 cm  Left ovary: length 4.4 cm; transverse 4.0 cm; AP 3.2 cm  FINDINGS:  UTERUS/CERVIX: The uterus is anteverted. No focal myometrial mass was seen. The endometrium was thickened with numerous internal cysts.  OVARIES: The ovaries were seen well transvaginally. Small cystic areas were seen within both ovaries compatible with follicles. Appropriate arterial inflow and venous outflow of the ovaries was documented on color and spectral Doppler imaging.  OTHER: No abnormal pelvic free fluid.  IMPRESSION:  Similar appearance of  thickened, multicystic endometrium that could represent cystic hyperplasia although nonemergent referral to gynecology is suggested.  Normal residual right ovarian tissue appears to remain present despite patient's reported history of right oophorectomy.   Spectral doppler was performed on bilateral ovaries.  Additional transabdominal images were not obtained.  3D reconstruction images were not obtained.  -------------------- On ultrasound (images not available to me at time of evaluation) the left ovary AND right ovary were identified.  Patient had right tube and ovary surgically removed, which was confirmed by laparoscopy in 07/2019.    Patient's last menstrual period was 02/04/2020 (exact date).     Past Medical History:  has a past medical history of Abnormal Pap smear of cervix, EIN (endometrial intraepithelial neoplasia), Morbid (severe) obesity due to excess calories (CMS-HCC) (08/25/2015), and PCOS (polycystic ovarian syndrome) (2017).  Past Surgical History:  has a past surgical history that includes Dilation and curettage of uterus (10/06/2018); Hysteroscopy; Laparoscopic ovarian cystectomy (Left); and Laparoscopic Salpingo-Oophorectomy (Right). Family History: family history includes Alzheimer's disease in her paternal grandfather; Atrial fibrillation (Abnormal heart rhythm sometimes requiring treatment with blood thinners) in her paternal grandfather;  Bipolar disorder in her mother; Breast cancer (age of onset: 56) in her maternal grandmother; Cervical cancer (age of onset: 89) in an other family member; Depression in her mother; Diabetes type II (age of onset: 33) in her mother; Lung cancer in her paternal grandmother; Prostate cancer in her maternal grandfather and paternal grandfather. Social History:  reports that she has never smoked. She has never used smokeless tobacco. She reports current alcohol use. She reports that she does not use drugs. OB/GYN History:          OB  History    Gravida  0   Para  0   Term  0   Preterm  0   AB  0   Living  0     SAB  0   TAB  0   Ectopic  0   Molar  0   Multiple  0   Live Births  0          Allergies: has No Known Allergies. Medications:  Current Outpatient Medications:  .  HYDROcodone-acetaminophen (NORCO) 5-325 mg tablet, Take 1 tablet by mouth every 6 (six) hours as needed for up to 12 doses (Patient not taking: Reported on 07/31/2019  ), Disp: 12 tablet, Rfl: 0 .  medroxyPROGESTERone (PROVERA) 10 MG tablet, Take 2 tablets (20 mg total) by mouth once daily, Disp: 60 tablet, Rfl: 11 .  metFORMIN (GLUCOPHAGE) 1000 MG tablet, Take 1 tablet (1,000 mg total) by mouth 2 (two) times daily with meals, Disp: 60 tablet, Rfl: 11 .  norgestimate-ethinyl estradioL (SPRINTEC 0.25/35, 28,) 0.25-35 mg-mcg tablet, Take 1 tablet by mouth once daily, Disp: 3 Package, Rfl: 1  Review of Systems (positives per HPI): General:                      No fatigue or weight loss Eyes:                           No vision changes Ears:                            No hearing difficulty Respiratory:                No cough or shortness of breath Cardiovascular:           No chest pain, palpitations, dyspnea on exertion Gastrointestinal:          No abdominal bloating, chronic diarrhea, constipation, masses, pain or hematochezia Genitourinary:             No hematuria, dysuria, abnormal vaginal discharge  Lymphatic:                   No swollen lymph nodes Musculoskeletal:         No muscle weakness Neurologic:                  No extremity weakness, syncope Psychiatric:                  No delusions or suicidal/homicidal ideation    Exam:   Constitutional: BP 138/78   Ht 152.4 cm (5')   Wt (!) 112.5 kg (248 lb)   LMP 02/04/2020 (Exact Date)   BMI 48.43 kg/m    WDWN female in NAD   HEENT: sclera clear, non-icteric, moist mucous membranes  Endocrine:  no thyromegaly Respiratory: normal respiratory  effort, CTABL    CV: no peripheral edema, RRR no MRG Skin: warm and well perfused, no rashes Neuro: alert, oriented x3,   Psych: appropriate mood and insight, judgement intact  Data: Reviewed: labs, results, imaging, previous records   Impression:   The primary encounter diagnosis was DUB (dysfunctional uterine bleeding). Diagnoses of PCOS (polycystic ovarian syndrome), Morbid obesity (CMS-HCC), Endometrial thickening on ultrasound, Acute cystitis with hematuria, and EIN (endometrial intraepithelial neoplasia) were also pertinent to this visit.   Plan:   Chronic Medical Conditions    Patient Active Problem List  Diagnosis  . Morbid (severe) obesity due to excess calories (CMS-HCC)  . Complex endometrial hyperplasia with atypia  . Primary anovulatory infertility  . Status post laparoscopy  . Infertility, female, unspecified  . EIN (endometrial intraepithelial neoplasia)   Dysfunctional Uterine Bleeding/ Hx EIN Endometrial lining thickened on ultrasound Stay on provera Will need D&C, hysteroscopy  Depending on pathology results, for desired future pregnancy, following D&C will try 2 cycles of ovulation induction with letrozole starting at 5 mg. If ovulation does not occur will refer to REI. On women's multi vitamin  Preop exam done today  Recommended Procedure: D&C Hysteroscopy   The patient and I discussed the technical aspects of the procedure including the potential for risks and complications.These include but are not limited to the risk of infection requiring post-operative antibiotics or further procedures.We talked about the risk of injury to adjacent organs including bladder, bowel,  blood vessels or nerves, the need to convert to an open incision, possibleneed for blood transfusion andpostop complications such asthromboembolic or cardiopulmonary complications.All of her questions were answered.  To be scheduled ASAP, hopefully next week.  Acute  Cystitis with Hematuria  Will follow culture and change antibiotic if needed  Until then, stay on Keflex   Return if symptoms worsen or fail to improve.  I personally performed the service. (TP)  Genelda Roark CRIST Nicey Krah, MD

## 2020-02-25 ENCOUNTER — Encounter
Admission: RE | Admit: 2020-02-25 | Discharge: 2020-02-25 | Disposition: A | Discharge status: Home / Self Care | Payer: BC Managed Care – PPO | Source: Ambulatory Visit | Attending: Obstetrics & Gynecology | Admitting: Obstetrics & Gynecology

## 2020-02-25 ENCOUNTER — Other Ambulatory Visit: Payer: Self-pay

## 2020-02-25 HISTORY — DX: Headache, unspecified: R51.9

## 2020-02-25 HISTORY — DX: Other specified postprocedural states: R11.2

## 2020-02-25 HISTORY — DX: Other specified postprocedural states: Z98.890

## 2020-02-25 NOTE — H&P (Signed)
Chief Complaint:      Chief Complaint  Patient presents with  . Menorrhagia    HPI:  Pamela Warren is a 29 y.o. female here for Menorrhagia  She is an established patient who presents today for a recurrent problem:              Location:  Uterus             Quality:  Bleeding             Severity:  Moderate - Severe            Duration:  2 weeks             Timing:  Constant             Modifying factors: provera makes better, nothing makes worse            Associated signs and symptoms:  +clotting, +pelvic pain, +dysuria, no fever or chills             Context:    2 weeks heavy bleeding with clots and pelvic pain  Seen yesterday at [facility] ED, ultrasound done.  UA was remarkable with concern for UTI. Given Keflex for acute cystitis and hematuria and provera to stop uterine bleeding   Has history of of complex hyperplasia with atypia dx'd several years ago, s/p progestin treatment.   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   Left tube and ovary present, left tube patent; patient strongly desires future fertility.  Gyn Onc recommended 39mos progestin and rebiopsy in 45mos It is now approximately 69mos since her diagnosis.    Yesterday @ ED - [facility]  02/19/2020 Ultrasound Uterus: 8.4 x 6.3 x 5.1 cm  Endometrium: 2.35 cm  Right ovary: length 2.2 cm; transverse 0.8 cm; AP 0.9 cm  Left ovary: length 4.4 cm; transverse 4.0 cm; AP 3.2 cm  FINDINGS:  UTERUS/CERVIX: The uterus is anteverted. No focal myometrial mass was seen. The endometrium was thickened with numerous internal cysts.  OVARIES: The ovaries were seen well transvaginally. Small cystic areas were seen within both ovaries compatible with follicles. Appropriate arterial inflow and venous outflow of the ovaries was documented on color and spectral Doppler imaging.  OTHER: No abnormal pelvic free fluid.  IMPRESSION:  Similar appearance of  thickened, multicystic endometrium that could represent cystic hyperplasia although nonemergent referral to gynecology is suggested.  Normal residual right ovarian tissue appears to remain present despite patient's reported history of right oophorectomy.   Spectral doppler was performed on bilateral ovaries.  Additional transabdominal images were not obtained.  3D reconstruction images were not obtained.  -------------------- On ultrasound (images not available to me at time of evaluation) the left ovary AND right ovary were identified.  Patient had right tube and ovary surgically removed, which was confirmed by laparoscopy in 07/2019.    Patient's last menstrual period was 02/04/2020 (exact date).     Past Medical History:  has a past medical history of Abnormal Pap smear of cervix, EIN (endometrial intraepithelial neoplasia), Morbid (severe) obesity due to excess calories (CMS-HCC) (08/25/2015), and PCOS (polycystic ovarian syndrome) (2017).  Past Surgical History:  has a past surgical history that includes Dilation and curettage of uterus (10/06/2018); Hysteroscopy; Laparoscopic ovarian cystectomy (Left); and Laparoscopic Salpingo-Oophorectomy (Right). Family History: family history includes Alzheimer's disease in her paternal grandfather; Atrial fibrillation (Abnormal heart rhythm sometimes requiring treatment with blood thinners) in her paternal grandfather;  Bipolar disorder in her mother; Breast cancer (age of onset: 56) in her maternal grandmother; Cervical cancer (age of onset: 89) in an other family member; Depression in her mother; Diabetes type II (age of onset: 33) in her mother; Lung cancer in her paternal grandmother; Prostate cancer in her maternal grandfather and paternal grandfather. Social History:  reports that she has never smoked. She has never used smokeless tobacco. She reports current alcohol use. She reports that she does not use drugs. OB/GYN History:          OB  History    Gravida  0   Para  0   Term  0   Preterm  0   AB  0   Living  0     SAB  0   TAB  0   Ectopic  0   Molar  0   Multiple  0   Live Births  0          Allergies: has No Known Allergies. Medications:  Current Outpatient Medications:  .  HYDROcodone-acetaminophen (NORCO) 5-325 mg tablet, Take 1 tablet by mouth every 6 (six) hours as needed for up to 12 doses (Patient not taking: Reported on 07/31/2019  ), Disp: 12 tablet, Rfl: 0 .  medroxyPROGESTERone (PROVERA) 10 MG tablet, Take 2 tablets (20 mg total) by mouth once daily, Disp: 60 tablet, Rfl: 11 .  metFORMIN (GLUCOPHAGE) 1000 MG tablet, Take 1 tablet (1,000 mg total) by mouth 2 (two) times daily with meals, Disp: 60 tablet, Rfl: 11 .  norgestimate-ethinyl estradioL (SPRINTEC 0.25/35, 28,) 0.25-35 mg-mcg tablet, Take 1 tablet by mouth once daily, Disp: 3 Package, Rfl: 1  Review of Systems (positives per HPI): General:                      No fatigue or weight loss Eyes:                           No vision changes Ears:                            No hearing difficulty Respiratory:                No cough or shortness of breath Cardiovascular:           No chest pain, palpitations, dyspnea on exertion Gastrointestinal:          No abdominal bloating, chronic diarrhea, constipation, masses, pain or hematochezia Genitourinary:             No hematuria, dysuria, abnormal vaginal discharge  Lymphatic:                   No swollen lymph nodes Musculoskeletal:         No muscle weakness Neurologic:                  No extremity weakness, syncope Psychiatric:                  No delusions or suicidal/homicidal ideation    Exam:   Constitutional: BP 138/78   Ht 152.4 cm (5')   Wt (!) 112.5 kg (248 lb)   LMP 02/04/2020 (Exact Date)   BMI 48.43 kg/m    WDWN female in NAD   HEENT: sclera clear, non-icteric, moist mucous membranes  Endocrine:  no thyromegaly Respiratory: normal respiratory  effort, CTABL    CV: no peripheral edema, RRR no MRG Skin: warm and well perfused, no rashes Neuro: alert, oriented x3,   Psych: appropriate mood and insight, judgement intact      Pelvic exam:  Limited by body habitus        External: Tanner stage 5, normal female genitalia for age without lesions or masses, no inguinal lymphadenopathy        Bladder: Normal size without masses or tenderness         Urethra: No lesions or discharge with palpation. Normal urethral size and location, no prolapse, no meatal caruncle, no diverticulum        Vagina: normal physiological discharge, without lesions or masses, no abnormal support structures        Cervix: no lesions or masses, no CMT, no discharge        Adnexa: non-tender, without masses or fullness bilaterally, exam limited by habitus        Uterus: exam limited by habitus        Anus/Perineum: Normal external exam, no engorged hemorrhoids        Rectovaginal exam: not indicated   Data: Reviewed: labs, results, imaging, previous records   Impression:   The primary encounter diagnosis was DUB (dysfunctional uterine bleeding). Diagnoses of PCOS (polycystic ovarian syndrome), Morbid obesity (CMS-HCC), Endometrial thickening on ultrasound, Acute cystitis with hematuria, and EIN (endometrial intraepithelial neoplasia) were also pertinent to this visit.   Plan:   Chronic Medical Conditions    Patient Active Problem List  Diagnosis  . Morbid (severe) obesity due to excess calories (CMS-HCC)  . Complex endometrial hyperplasia with atypia  . Primary anovulatory infertility  . Status post laparoscopy  . Infertility, female, unspecified  . EIN (endometrial intraepithelial neoplasia)   Dysfunctional Uterine Bleeding/ Hx EIN Endometrial lining thickened on ultrasound Stay on provera Will need D&C, hysteroscopy  Depending on pathology results, for desired future pregnancy, following D&C will try 2 cycles of ovulation induction with  letrozole starting at 5 mg. If ovulation does not occur will refer to REI. On women's multi vitamin  Preop exam done today  Recommended Procedure: D&C Hysteroscopy   The patient and I discussed the technical aspects of the procedure including the potential for risks and complications.These include but are not limited to the risk of infection requiring post-operative antibiotics or further procedures.We talked about the risk of injury to adjacent organs including bladder, bowel,  blood vessels or nerves, the need to convert to an open incision, possibleneed for blood transfusion andpostop complications such asthromboembolic or cardiopulmonary complications.All of her questions were answered.  To be scheduled ASAP, hopefully next week.  Acute Cystitis with Hematuria  Will follow culture and change antibiotic if needed  Until then, stay on Keflex   Return if symptoms worsen or fail to improve.  I personally performed the service. (TP)  Merin Borjon CRIST Demareon Coldwell, MD

## 2020-02-25 NOTE — Patient Instructions (Addendum)
Your procedure is scheduled on: 10/ 08/ 21 Report to Day Surgery on the 2nd floor of the [facility]. To find out your arrival time, please call [phone] between 1PM - 3PM on: 02/26/20  REMEMBER: Instructions that are not followed completely may result in serious medical risk, up to and including death; or upon the discretion of your surgeon and anesthesiologist your surgery may need to be rescheduled.  Do not eat food after midnight the night before surgery.  No gum chewing, lozengers or hard candies.  You may however, drink CLEAR liquids up to 2 hours before you are scheduled to arrive for your surgery. Do not drink anything within 2 hours of your scheduled arrival time.  Clear liquids include: - water  - apple juice without pulp - gatorade (not RED) - black coffee or tea (Do NOT add milk or creamers to the coffee or tea) Do NOT drink anything that is not on this list.  In addition, your doctor has ordered for you to drink the provided  Ensure Pre-Surgery Clear Carbohydrate Drink  Gatorade G2 Drinking this carbohydrate drink up to two hours before surgery helps to reduce insulin resistance and improve patient outcomes. Please complete drinking 2 hours prior to scheduled arrival time.  TAKE THESE MEDICATIONS THE MORNING OF SURGERY WITH A SIP OF WATER: 1. May take Tylenol as needed.  Stop Metformin 2 days prior to surgery.  One week prior to surgery: Stop Anti-inflammatories (NSAIDS) such as Advil, Aleve, Ibuprofen, Motrin, Naproxen, Naprosyn and Aspirin based products such as Excedrin, Goodys Powder, BC Powder. Stop ANY OVER THE COUNTER supplements until after surgery. (You may continue taking Tylenol, Vitamin D, Vitamin B, and multivitamin.)  No Alcohol for 24 hours before or after surgery.  No Smoking including e-cigarettes for 24 hours prior to surgery.  No chewable tobacco products for at least 6 hours prior to surgery.  No nicotine patches on the day of  surgery.  Do not use any "recreational" drugs for at least a week prior to your surgery.  Please be advised that the combination of cocaine and anesthesia may have negative outcomes, up to and including death. If you test positive for cocaine, your surgery will be cancelled.  On the morning of surgery brush your teeth with toothpaste and water, you may rinse your mouth with mouthwash if you wish. Do not swallow any toothpaste or mouthwash.  Do not wear jewelry, make-up, hairpins, clips or nail polish.  Do not wear lotions, powders, or perfumes.   Do not shave 48 hours prior to surgery.   Contact lenses, hearing aids and dentures may not be worn into surgery.  Do not bring valuables to the hospital. [facility] is not responsible for any missing/lost belongings or valuables.   Notify your doctor if there is any change in your medical condition (cold, fever, infection).  Wear comfortable clothing (specific to your surgery type) to the hospital.  Plan for stool softeners for home use; pain medications have a tendency to cause constipation. You can also help prevent constipation by eating foods high in fiber such as fruits and vegetables and drinking plenty of fluids as your diet allows.  After surgery, you can help prevent lung complications by doing breathing exercises.  Take deep breaths and cough every 1-2 hours. Your doctor may order a device called an Incentive Spirometer to help you take deep breaths. IIf you are being discharged the day of surgery, you will not be allowed to drive home. You  will need a responsible adult (18 years or older) to drive you home and stay with you that night.   If you are taking public transportation, you will need to have a responsible adult (18 years or older) with you. Please confirm with your physician that it is acceptable to use public transportation.   Please call the Pre-admissions Testing Dept. at [phone] if you have any questions  about these instructions.  Visitation Policy:  Patients undergoing a surgery or procedure may have one family member or support person with them as long as that person is not COVID-19 positive or experiencing its symptoms.  That person may remain in the waiting area during the procedure.

## 2020-02-26 ENCOUNTER — Other Ambulatory Visit
Admission: RE | Admit: 2020-02-26 | Discharge: 2020-02-26 | Disposition: A | Discharge status: Home / Self Care | Payer: BC Managed Care – PPO | Source: Ambulatory Visit | Attending: Obstetrics & Gynecology | Admitting: Obstetrics & Gynecology

## 2020-02-26 DIAGNOSIS — Z20822 Contact with and (suspected) exposure to covid-19: Secondary | ICD-10-CM | POA: Diagnosis not present

## 2020-02-26 DIAGNOSIS — Z01812 Encounter for preprocedural laboratory examination: Secondary | ICD-10-CM | POA: Diagnosis not present

## 2020-02-26 LAB — CBC
HCT: 38.4 % (ref 36.0–46.0)
Hemoglobin: 12.8 g/dL (ref 12.0–15.0)
MCH: 28.4 pg (ref 26.0–34.0)
MCHC: 33.3 g/dL (ref 30.0–36.0)
MCV: 85.3 fL (ref 80.0–100.0)
Platelets: 302 10*3/uL (ref 150–400)
RBC: 4.5 MIL/uL (ref 3.87–5.11)
RDW: 13.2 % (ref 11.5–15.5)
WBC: 8.8 10*3/uL (ref 4.0–10.5)
nRBC: 0 % (ref 0.0–0.2)

## 2020-02-26 LAB — TYPE AND SCREEN
ABO/RH(D): O POS
Antibody Screen: NEGATIVE

## 2020-02-26 LAB — BASIC METABOLIC PANEL
Anion gap: 10 (ref 5–15)
BUN: 11 mg/dL (ref 6–20)
CO2: 23 mmol/L (ref 22–32)
Calcium: 9.1 mg/dL (ref 8.9–10.3)
Chloride: 104 mmol/L (ref 98–111)
Creatinine, Ser: 0.61 mg/dL (ref 0.44–1.00)
GFR calc non Af Amer: >60 mL/min (ref >60)
Glucose, Bld: 89 mg/dL (ref 70–99)
Potassium: 4 mmol/L (ref 3.5–5.1)
Sodium: 137 mmol/L (ref 135–145)

## 2020-02-26 LAB — SARS CORONAVIRUS 2 (TAT 6-24 HRS): SARS Coronavirus 2: NEGATIVE

## 2020-02-27 ENCOUNTER — Encounter: Payer: Self-pay | Admitting: Obstetrics & Gynecology

## 2020-02-27 ENCOUNTER — Encounter
Admission: RE | Disposition: A | Discharge status: Home / Self Care | Payer: Self-pay | Source: Home / Self Care | Attending: Obstetrics & Gynecology

## 2020-02-27 ENCOUNTER — Other Ambulatory Visit: Payer: Self-pay

## 2020-02-27 ENCOUNTER — Ambulatory Visit: Payer: BC Managed Care – PPO | Admitting: Anesthesiology

## 2020-02-27 ENCOUNTER — Ambulatory Visit
Admission: RE | Admit: 2020-02-27 | Discharge: 2020-02-27 | Disposition: A | Discharge status: Home / Self Care | Payer: BC Managed Care – PPO | Attending: Obstetrics & Gynecology | Admitting: Obstetrics & Gynecology

## 2020-02-27 DIAGNOSIS — Z793 Long term (current) use of hormonal contraceptives: Secondary | ICD-10-CM | POA: Insufficient documentation

## 2020-02-27 DIAGNOSIS — N938 Other specified abnormal uterine and vaginal bleeding: Secondary | ICD-10-CM | POA: Diagnosis present

## 2020-02-27 DIAGNOSIS — Z90721 Acquired absence of ovaries, unilateral: Secondary | ICD-10-CM | POA: Insufficient documentation

## 2020-02-27 DIAGNOSIS — N921 Excessive and frequent menstruation with irregular cycle: Secondary | ICD-10-CM | POA: Insufficient documentation

## 2020-02-27 DIAGNOSIS — N8502 Endometrial intraepithelial neoplasia [EIN]: Secondary | ICD-10-CM | POA: Insufficient documentation

## 2020-02-27 DIAGNOSIS — Z7984 Long term (current) use of oral hypoglycemic drugs: Secondary | ICD-10-CM | POA: Diagnosis not present

## 2020-02-27 DIAGNOSIS — Z6841 Body Mass Index (BMI) 40.0 and over, adult: Secondary | ICD-10-CM | POA: Diagnosis not present

## 2020-02-27 DIAGNOSIS — R9389 Abnormal findings on diagnostic imaging of other specified body structures: Secondary | ICD-10-CM | POA: Diagnosis present

## 2020-02-27 DIAGNOSIS — E282 Polycystic ovarian syndrome: Secondary | ICD-10-CM | POA: Insufficient documentation

## 2020-02-27 HISTORY — PX: HYSTEROSCOPY WITH D & C: SHX1775

## 2020-02-27 SURGERY — DILATATION AND CURETTAGE /HYSTEROSCOPY
Anesthesia: General | Site: Vagina

## 2020-02-27 MED ORDER — OXYCODONE HCL 5 MG PO TABS
5.0000 mg | ORAL_TABLET | Freq: Once | ORAL | Status: AC | PRN
  Administered 2020-02-27: 5 mg via ORAL

## 2020-02-27 MED ORDER — ROCURONIUM BROMIDE 10 MG/ML (PF) SYRINGE
PREFILLED_SYRINGE | INTRAVENOUS | Status: AC
  Filled 2020-02-27: qty 10

## 2020-02-27 MED ORDER — PROPOFOL 10 MG/ML IV BOLUS
INTRAVENOUS | Status: AC
  Filled 2020-02-27: qty 20

## 2020-02-27 MED ORDER — OXYCODONE HCL 5 MG/5ML PO SOLN: 5.0000 mg | Freq: Once | ORAL | Status: AC | PRN

## 2020-02-27 MED ORDER — ACETAMINOPHEN 10 MG/ML IV SOLN
INTRAVENOUS | Status: DC | PRN
  Administered 2020-02-27: 1000 mg via INTRAVENOUS

## 2020-02-27 MED ORDER — CHLORHEXIDINE GLUCONATE 0.12 % MT SOLN: 15.0000 mL | Freq: Once | OROMUCOSAL | Status: AC

## 2020-02-27 MED ORDER — ONDANSETRON HCL 4 MG/2ML IJ SOLN
INTRAMUSCULAR | Status: DC | PRN
  Administered 2020-02-27: 4 mg via INTRAVENOUS

## 2020-02-27 MED ORDER — SCOPOLAMINE 1 MG/3DAYS TD PT72: 1.0000 | MEDICATED_PATCH | TRANSDERMAL | Status: DC

## 2020-02-27 MED ORDER — FENTANYL CITRATE (PF) 100 MCG/2ML IJ SOLN
INTRAMUSCULAR | Status: AC
  Filled 2020-02-27: qty 2

## 2020-02-27 MED ORDER — PROPOFOL 10 MG/ML IV BOLUS
INTRAVENOUS | Status: DC | PRN
  Administered 2020-02-27: 200 mg via INTRAVENOUS

## 2020-02-27 MED ORDER — SUGAMMADEX SODIUM 500 MG/5ML IV SOLN
INTRAVENOUS | Status: AC
  Filled 2020-02-27: qty 5

## 2020-02-27 MED ORDER — SODIUM CHLORIDE 0.9 % IR SOLN
Status: DC | PRN
  Administered 2020-02-27: 1000 mL

## 2020-02-27 MED ORDER — MEDROXYPROGESTERONE ACETATE 10 MG PO TABS
20.0000 mg | ORAL_TABLET | Freq: Two times a day (BID) | ORAL | 7 refills | Status: DC
Start: 2020-02-27 — End: 2023-02-07

## 2020-02-27 MED ORDER — FAMOTIDINE 20 MG PO TABS
ORAL_TABLET | ORAL | Status: AC
  Administered 2020-02-27: 20 mg via ORAL
  Filled 2020-02-27: qty 1

## 2020-02-27 MED ORDER — LACTATED RINGERS IV SOLN: INTRAVENOUS | Status: DC

## 2020-02-27 MED ORDER — LIDOCAINE HCL (CARDIAC) PF 100 MG/5ML IV SOSY
PREFILLED_SYRINGE | INTRAVENOUS | Status: DC | PRN
  Administered 2020-02-27: 100 mg via INTRAVENOUS

## 2020-02-27 MED ORDER — ONDANSETRON HCL 4 MG/2ML IJ SOLN
INTRAMUSCULAR | Status: AC
  Filled 2020-02-27: qty 2

## 2020-02-27 MED ORDER — POVIDONE-IODINE 10 % EX SWAB: 2.0000 "application " | Freq: Once | CUTANEOUS | Status: DC

## 2020-02-27 MED ORDER — ORAL CARE MOUTH RINSE: 15.0000 mL | Freq: Once | OROMUCOSAL | Status: AC

## 2020-02-27 MED ORDER — FAMOTIDINE 20 MG PO TABS: 20.0000 mg | ORAL_TABLET | Freq: Once | ORAL | Status: AC

## 2020-02-27 MED ORDER — ROCURONIUM BROMIDE 100 MG/10ML IV SOLN
INTRAVENOUS | Status: DC | PRN
  Administered 2020-02-27: 40 mg via INTRAVENOUS

## 2020-02-27 MED ORDER — DEXAMETHASONE SODIUM PHOSPHATE 10 MG/ML IJ SOLN
INTRAMUSCULAR | Status: AC
  Filled 2020-02-27: qty 1

## 2020-02-27 MED ORDER — CHLORHEXIDINE GLUCONATE 0.12 % MT SOLN
OROMUCOSAL | Status: AC
  Administered 2020-02-27: 15 mL via OROMUCOSAL
  Filled 2020-02-27: qty 15

## 2020-02-27 MED ORDER — OXYCODONE HCL 5 MG PO TABS
ORAL_TABLET | ORAL | Status: AC
  Filled 2020-02-27: qty 1

## 2020-02-27 MED ORDER — LIDOCAINE HCL (PF) 2 % IJ SOLN
INTRAMUSCULAR | Status: AC
  Filled 2020-02-27: qty 5

## 2020-02-27 MED ORDER — PROMETHAZINE HCL 25 MG/ML IJ SOLN: 6.2500 mg | INTRAMUSCULAR | Status: DC | PRN

## 2020-02-27 MED ORDER — SCOPOLAMINE 1 MG/3DAYS TD PT72
MEDICATED_PATCH | TRANSDERMAL | Status: AC
  Administered 2020-02-27: 1.5 mg via TRANSDERMAL
  Filled 2020-02-27: qty 1

## 2020-02-27 MED ORDER — ACETAMINOPHEN 10 MG/ML IV SOLN
INTRAVENOUS | Status: AC
  Filled 2020-02-27: qty 100

## 2020-02-27 MED ORDER — DEXAMETHASONE SODIUM PHOSPHATE 10 MG/ML IJ SOLN
INTRAMUSCULAR | Status: DC | PRN
  Administered 2020-02-27: 10 mg via INTRAVENOUS

## 2020-02-27 MED ORDER — SUGAMMADEX SODIUM 500 MG/5ML IV SOLN
INTRAVENOUS | Status: DC | PRN
  Administered 2020-02-27: 220 mg via INTRAVENOUS

## 2020-02-27 MED ORDER — DEXMEDETOMIDINE (PRECEDEX) IN NS 20 MCG/5ML (4 MCG/ML) IV SYRINGE
PREFILLED_SYRINGE | INTRAVENOUS | Status: AC
  Filled 2020-02-27: qty 5

## 2020-02-27 MED ORDER — MIDAZOLAM HCL 2 MG/2ML IJ SOLN
INTRAMUSCULAR | Status: AC
  Filled 2020-02-27: qty 2

## 2020-02-27 MED ORDER — MIDAZOLAM HCL 2 MG/2ML IJ SOLN
INTRAMUSCULAR | Status: DC | PRN
  Administered 2020-02-27: 2 mg via INTRAVENOUS

## 2020-02-27 MED ORDER — FENTANYL CITRATE (PF) 100 MCG/2ML IJ SOLN
25.0000 ug | INTRAMUSCULAR | Status: DC | PRN
  Administered 2020-02-27: 25 ug via INTRAVENOUS

## 2020-02-27 MED ORDER — DEXMEDETOMIDINE (PRECEDEX) IN NS 20 MCG/5ML (4 MCG/ML) IV SYRINGE
PREFILLED_SYRINGE | INTRAVENOUS | Status: DC | PRN
  Administered 2020-02-27: 12 ug via INTRAVENOUS

## 2020-02-27 MED ORDER — FENTANYL CITRATE (PF) 100 MCG/2ML IJ SOLN
INTRAMUSCULAR | Status: DC | PRN
  Administered 2020-02-27 (×2): 50 ug via INTRAVENOUS

## 2020-02-27 SURGICAL SUPPLY — 19 items
COVER WAND RF STERILE (DRAPES) ×3 IMPLANT
DEVICE MYOSURE LITE (MISCELLANEOUS) IMPLANT
DEVICE MYOSURE REACH (MISCELLANEOUS) IMPLANT
ELECT REM PT RETURN 9FT ADLT (ELECTROSURGICAL) ×3
ELECTRODE REM PT RTRN 9FT ADLT (ELECTROSURGICAL) ×1 IMPLANT
GLOVE PI ORTHOPRO 6.5 (GLOVE) ×2
GLOVE PI ORTHOPRO STRL 6.5 (GLOVE) ×1 IMPLANT
GLOVE SURG SYN 6.5 ES PF (GLOVE) ×6 IMPLANT
GOWN STRL REUS W/ TWL LRG LVL3 (GOWN DISPOSABLE) ×2 IMPLANT
GOWN STRL REUS W/TWL LRG LVL3 (GOWN DISPOSABLE) ×6
IV LACTATED RINGERS 1000ML (IV SOLUTION) ×3 IMPLANT
KIT PROCEDURE FLUENT (KITS) IMPLANT
KIT TURNOVER CYSTO (KITS) ×3 IMPLANT
PACK DNC HYST (MISCELLANEOUS) ×3 IMPLANT
PAD OB MATERNITY 4.3X12.25 (PERSONAL CARE ITEMS) ×3 IMPLANT
PAD PREP 24X41 OB/GYN DISP (PERSONAL CARE ITEMS) ×3 IMPLANT
SEAL ROD LENS SCOPE MYOSURE (ABLATOR) ×3 IMPLANT
TUBING CONNECTING 10 (TUBING) ×2 IMPLANT
TUBING CONNECTING 10' (TUBING) ×1

## 2020-02-27 NOTE — Transfer of Care (Signed)
Immediate Anesthesia Transfer of Care Note  Patient: Pamela Warren  Procedure(s) Performed: DILATATION AND CURETTAGE /HYSTEROSCOPY (N/A Vagina )  Patient Location: PACU  Anesthesia Type:General  Level of Consciousness: sedated  Airway & Oxygen Therapy: Patient connected to face mask oxygen  Post-op Assessment: Post -op Vital signs reviewed and stable  Post vital signs: stable  Last Vitals:  Vitals Value Taken Time  BP 99/59 02/27/20 1007  Temp    Pulse 79 02/27/20 1011  Resp 17 02/27/20 1011  SpO2 100 % 02/27/20 1011  Vitals shown include unvalidated device data.  Last Pain:  Vitals:   02/27/20 0800  TempSrc: Oral  PainSc: 0-No pain         Complications: No complications documented.

## 2020-02-27 NOTE — Anesthesia Procedure Notes (Signed)
Procedure Name: Intubation Date/Time: 02/27/2020 9:17 AM Performed by: Irving Burton, CRNA Pre-anesthesia Checklist: Patient identified, Emergency Drugs available, Suction available and Patient being monitored Patient Re-evaluated:Patient Re-evaluated prior to induction Oxygen Delivery Method: Circle system utilized Preoxygenation: Pre-oxygenation with 100% oxygen Induction Type: IV induction Ventilation: Mask ventilation without difficulty Laryngoscope Size: McGraph and 3 Grade View: Grade I Tube type: Oral Tube size: 7.0 mm Number of attempts: 1 Airway Equipment and Method: Stylet and Video-laryngoscopy Placement Confirmation: ETT inserted through vocal cords under direct vision,  positive ETCO2 and breath sounds checked- equal and bilateral Secured at: 21 cm Tube secured with: Tape Dental Injury: Teeth and Oropharynx as per pre-operative assessment  Difficulty Due To: Difficult Airway- due to limited oral opening and Difficult Airway- due to anterior larynx

## 2020-02-27 NOTE — Anesthesia Preprocedure Evaluation (Signed)
Anesthesia Evaluation  Patient identified by MRN, date of birth, ID band Patient awake    Reviewed: Allergy & Precautions, H&P , NPO status , Patient's Chart, lab work & pertinent test results, reviewed documented beta blocker date and time   History of Anesthesia Complications (+) PONV and history of anesthetic complications  Airway Mallampati: III  TM Distance: <3 FB Neck ROM: full    Dental  (+) Chipped   Pulmonary neg pulmonary ROS, neg shortness of breath,    Pulmonary exam normal        Cardiovascular Exercise Tolerance: Good negative cardio ROS Normal cardiovascular exam     Neuro/Psych  Headaches, Anxiety negative psych ROS   GI/Hepatic negative GI ROS, Neg liver ROS, neg GERD  ,  Endo/Other  negative endocrine ROSMorbid obesity  Renal/GU negative Renal ROS  Female GU complaint     Musculoskeletal negative musculoskeletal ROS (+)   Abdominal Normal abdominal exam  (+)   Peds negative pediatric ROS (+)  Hematology negative hematology ROS (+)   Anesthesia Other Findings Past Medical History: No date: Anxiety No date: Complex endometrial hyperplasia     Comment:  right No date: Heavy menstrual bleeding No date: Infertility, female No date: Ovarian cyst No date: Ovarian cyst No date: PCOS (polycystic ovarian syndrome)  BMI    Body Mass Index: 41.90 kg/m      Reproductive/Obstetrics negative OB ROS                             Anesthesia Physical  Anesthesia Plan  ASA: III  Anesthesia Plan: General ETT   Post-op Pain Management:    Induction: Intravenous, Rapid sequence and Cricoid pressure planned  PONV Risk Score and Plan: Ondansetron, Dexamethasone, Midazolam and Treatment may vary due to age or medical condition  Airway Management Planned: Oral ETT  Additional Equipment:   Intra-op Plan:   Post-operative Plan: Extubation in OR  Informed Consent: I have  reviewed the patients History and Physical, chart, labs and discussed the procedure including the risks, benefits and alternatives for the proposed anesthesia with the patient or authorized representative who has indicated his/her understanding and acceptance.     Dental Advisory Given  Plan Discussed with: CRNA  Anesthesia Plan Comments: (Patient consented for risks of anesthesia including but not limited to:  - adverse reactions to medications - damage to eyes, teeth, lips or other oral mucosa - nerve damage due to positioning  - sore throat or hoarseness - Damage to heart, brain, nerves, lungs, other parts of body or loss of life  Patient voiced understanding.)        Anesthesia Quick Evaluation

## 2020-02-27 NOTE — Interval H&P Note (Signed)
History and Physical Interval Note:  02/27/2020 8:50 AM  Pamela Warren  has presented today for surgery, with the diagnosis of thickened endometrium, dysfunctional uterine bleeding, history of endometrial hyperplasia.  The various methods of treatment have been discussed with the patient and family. After consideration of risks, benefits and other options for treatment, the patient has consented to  Procedure(s): DILATATION AND CURETTAGE /HYSTEROSCOPY (N/A) as a surgical intervention.  The patient's history has been reviewed, patient examined, no change in status, stable for surgery.  I have reviewed the patient's chart and labs.  Questions were answered to the patient's satisfaction.     Kynsleigh Westendorf C Laurella Tull

## 2020-02-27 NOTE — OR Nursing (Addendum)
Per Dr. Elesa Massed, verbal, no rx written for pain med.  Tell pt to take tylenol or ibuprofen if needed.  Also, provera is to be 2 tablets once daily.  Added to d/c instructions and reviewed with patient.  States she will be in to see pt in postop shortly.

## 2020-02-27 NOTE — Discharge Instructions (Addendum)
PER DR. WARD, TAKE TYLENOL OR IBUPROFEN FOR DISCOMFORT IF NEEDED.  SCRIPT FOR NEW ADMINISTRATION DOSAGE OF PROVERA SENT TO YOUR PHARMACY.    You should expect to have some cramping and vaginal bleeding for about a week. This should taper off and subside, much like a period. If heavy bleeding continues or gets worse, you should contact the office for an earlier appointment.   Please call the office or physician on call for fever >101, severe pain, and heavy bleeding.   [phone]  NOTHING IN THE VAGINA FOR 2 WEEKS!!  Dr. Elesa Massed will discuss pathology results with you at your postop visit. Please remain on the Provera 20mg  daily until your pathology returns.    AMBULATORY SURGERY  DISCHARGE INSTRUCTIONS   1) The drugs that you were given will stay in your system until tomorrow so for the next 24 hours you should not:  A) Drive an automobile B) Make any legal decisions C) Drink any alcoholic beverage   2) You may resume regular meals tomorrow.  Today it is better to start with liquids and gradually work up to solid foods.  You may eat anything you prefer, but it is better to start with liquids, then soup and crackers, and gradually work up to solid foods.   3) Please notify your doctor immediately if you have any unusual bleeding, trouble breathing, redness and pain at the surgery site, drainage, fever, or pain not relieved by medication.    4) Additional Instructions:        Please contact your physician with any problems or Same Day Surgery at [phone], Monday through Friday 6 am to 4 pm, or [place] at [facility] number at [phone].

## 2020-02-27 NOTE — Anesthesia Postprocedure Evaluation (Signed)
Anesthesia Post Note  Patient: Pamela Warren  Procedure(s) Performed: DILATATION AND CURETTAGE /HYSTEROSCOPY (N/A Vagina )  Patient location during evaluation: PACU Anesthesia Type: General Level of consciousness: awake and alert and oriented Pain management: pain level controlled Vital Signs Assessment: post-procedure vital signs reviewed and stable Respiratory status: spontaneous breathing, nonlabored ventilation and respiratory function stable Cardiovascular status: blood pressure returned to baseline and stable Postop Assessment: no signs of nausea or vomiting Anesthetic complications: no   No complications documented.   Last Vitals:  Vitals:   02/27/20 1155 02/27/20 1243  BP: (!) 102/58 106/61  Pulse: 74 76  Resp: 18 16  Temp:  (!) 36.3 C  SpO2: 100% 100%    Last Pain:  Vitals:   02/27/20 1243  TempSrc: Temporal  PainSc: 0-No pain                 Ryver Zadrozny

## 2020-02-27 NOTE — OR Nursing (Signed)
Dr. Elesa Massed in to see pt in postop @ 12:43 pm.

## 2020-02-27 NOTE — Op Note (Addendum)
Operative Report Hysteroscopy, Dilation and Curettage 02/27/2020  Patient:  Pamela Warren  29 y.o. female Preoperative diagnosis:  thickened endometrium, dysfunctional uterine bleeding, history of endometrial hyperplasia Postoperative diagnosis:  thickened endometrium, dysfunctional uterine bleeding, history of endometrial hyperplasia  PROCEDURE:  Procedure(s): DILATATION AND CURETTAGE /HYSTEROSCOPY (N/A) Surgeon:  Surgeon(s) and Role:    * Kimiah Hibner, Elenora Fender, MD - Primary    * Schermerhorn, Ihor Austin, MD - Assisting Anesthesia:  GET I/O: Total I/O In: 700 [IV Piggyback:100] Out: 170 [Urine:150; Blood:20] Specimens:  Endometrial curettings Complications: None Apparent Disposition:  VS stable to PACU  Findings: Uterus, mobile,sounding to 8 cm; normal cervix, vagina, perineum. Exam limited due to body habitus. Hysteroscopic findings:  Diffusely fluffy endometrium with no polyps or masses.  Visualization was limited secondary to blood in cavity.   Urine pregnancy test was negative.  Indication for procedure/Consents: 29 y.o. G0P0000  here for scheduled surgery for the aforementioned diagnoses.  Risks of surgery were discussed with the patient including but not limited to: bleeding which may require transfusion; infection which may require antibiotics; injury to uterus or surrounding organs; intrauterine scarring which may impair future fertility; need for additional procedures including laparotomy or laparoscopy; and other postoperative/anesthesia complications. Written informed consent was obtained.    Procedure Details:   The patient was then taken to the operating room where anesthesia was administered.  After a formal timeout was performed, she was placed in the dorsal lithotomy position and examined with the above findings. She was then prepped and draped in the sterile manner.  A speculum was then placed in the patient's vagina and a single tooth tenaculum was applied to the anterior  lip of the cervix.   The uterus was sounded to 8cm. Her cervix was serially dilated to accommodate the myoscope, which was inserted with findings as above. A sharp curettage was then performed until there was a gritty texture in all four quadrants. The specimen was handed off to nursing.  The camera was reinserted but again visualization was limited. The tenaculum was removed from the anterior lip of the cervix and the vaginal speculum was removed after noting good hemostasis. The patient tolerated the procedure well and was taken to the recovery area awake, extubated and in stable condition.  The patient will be discharged to home as per PACU criteria.  Routine postoperative instructions given. She will follow up in the clinic in two to four weeks for postoperative evaluation.  Ranae Plumber, MD [facility] OBGYN Attending Gynecologist

## 2020-02-27 NOTE — Interval H&P Note (Signed)
Patient was unable to urinate for a pregnancy test.  -patient had been bleeding heavily x >2 weeks and was on provera to decrease bleeding amount, yet continued to bleed with treatment. (vaginal bleeding) -negative pregnancy test on 02/19/20 -no ultrasound findings supporting pregnancy 02/19/20 -no intercourse in the prior 6 weeks, per patient.  Due to this we elected to proceed with induction of anesthesia, as the treatment of hemorrhage secondary to early pregnancy loss would have been the same as today's procedure.  Patient provided verbal consent prior to moving to the OR, understanding a test would be performed in the OR.     CW

## 2020-02-28 ENCOUNTER — Encounter: Payer: Self-pay | Admitting: Obstetrics & Gynecology

## 2020-03-02 LAB — SURGICAL PATHOLOGY

## 2020-09-06 ENCOUNTER — Ambulatory Visit (INDEPENDENT_AMBULATORY_CARE_PROVIDER_SITE_OTHER): Payer: Self-pay | Admitting: Family Medicine

## 2020-09-06 ENCOUNTER — Other Ambulatory Visit: Payer: Self-pay

## 2020-09-06 ENCOUNTER — Encounter: Payer: Self-pay | Admitting: Family Medicine

## 2020-09-06 VITALS — BP 119/67 | HR 83 | Ht 64.0 in | Wt 259.6 lb

## 2020-09-06 DIAGNOSIS — Z7689 Persons encountering health services in other specified circumstances: Secondary | ICD-10-CM

## 2020-09-06 DIAGNOSIS — Z6841 Body Mass Index (BMI) 40.0 and over, adult: Secondary | ICD-10-CM

## 2020-09-06 DIAGNOSIS — N921 Excessive and frequent menstruation with irregular cycle: Secondary | ICD-10-CM

## 2020-09-06 DIAGNOSIS — E282 Polycystic ovarian syndrome: Secondary | ICD-10-CM

## 2020-09-06 DIAGNOSIS — F411 Generalized anxiety disorder: Secondary | ICD-10-CM | POA: Insufficient documentation

## 2020-09-06 MED ORDER — ESCITALOPRAM OXALATE 10 MG PO TABS: 10.0000 mg | ORAL_TABLET | Freq: Every day | ORAL | 2 refills | Status: DC

## 2020-09-06 NOTE — Patient Instructions (Addendum)
Thank you for coming to the office today.   Check with Dr Elesa Massed about date of last pap smear, and we would need a copy of the result. Or need to know the date of the next one.  Start Escitalopram 10mg  daily, take in morning consistently with food.  Future consider Buspar (buspirone) add on anxiety med if needed. Consider therapist referral if interested. Can adjust dose of Lexapro, can consider Citalopram as well instead.   Please schedule a Follow-up Appointment to: Return in about 4 weeks (around 10/04/2020) for 4-6 week virtual follow-up Anxiety medication.  If you have any other questions or concerns, please feel free to call the office or send a message through MyChart. You may also schedule an earlier appointment if necessary.  Additionally, you may be receiving a survey about your experience at our office within a few days to 1 week by e-mail or mail. We value your feedback.  10/06/2020, DO [facility], [facility]

## 2020-09-06 NOTE — Progress Notes (Signed)
Subjective:    Patient ID: Pamela Warren, female    DOB: 24-May-1990, 30 y.o.   MRN: [id number]  Pamela Warren is a 30 y.o. female presenting on 09/06/2020 for Establish Care  HPI   She has a medical evaluation form to be foster parent. No TB risk factor and no communicable disease. See below for documented medical conditions and mental health   Anxiety Reports chronic problem for years, but never treated. Has taken a Citalopram anti anxiety med in past from family, she says it was helpful. She has not endorsed depression or mood disorder. She has not had significant panic attacks except very extreme scenario. 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 Not interested in therapist.  Morbid Obesity BMI >44 PCOS Followed by [facility] Dr Leeroy Bock Ward Last D&C procedure 02/2020, she has tried variety of medications, awaiting for menstrual cycle and may need other intervention Prior history of pap smear, but cannot track record. Home gym exercise, 3 times weekly 30-73min, cardio and weights   Health Maintenance:  request copy of last pap smear.  Depression screen [facility] 2/9 09/06/2020 01/04/2018  Decreased Interest 0 0  Down, Depressed, Hopeless 0 0  PHQ - 2 Score 0 0   GAD 7 : Generalized Anxiety Score 09/06/2020  Nervous, Anxious, on Edge 3  Control/stop worrying 2  Worry too much - different things 3  Trouble relaxing 3  Restless 2  Easily annoyed or irritable 0  Afraid - awful might happen 2  Total GAD 7 Score 15  Anxiety Difficulty Not difficult at all     Past Medical History:  Diagnosis Date  . Anxiety   . Complex endometrial hyperplasia    right  . Headache    stress/job related  . Heavy menstrual bleeding   . Infertility, female   . Ovarian cyst   . Ovarian cyst   . PCOS (polycystic ovarian syndrome)   . PONV (postoperative nausea and vomiting)    nausea and  vomiting   Past Surgical History:  Procedure Laterality Date  . APPENDECTOMY    . CHROMOPERTUBATION N/A 08/08/2019   Procedure: CHROMOPERTUBATION;  Surgeon: Ward, Elenora Fender, MD;  Location: [facility] ORS;  Service: Gynecology;  Laterality: N/A;  . DILATION AND CURETTAGE OF UTERUS     2015- cyst in tube -Dr Binnie Rail  . HYSTEROSCOPY WITH D & C N/A 10/05/2017   Procedure: DILATATION AND CURETTAGE /HYSTEROSCOPY;  Surgeon: Defrancesco, Prentice Docker, MD;  Location: [facility] ORS;  Service: Gynecology;  Laterality: N/A;  . HYSTEROSCOPY WITH D & C N/A 08/08/2019   Procedure: DILATATION AND CURETTAGE /HYSTEROSCOPY;  Surgeon: Ward, Elenora Fender, MD;  Location: [facility] ORS;  Service: Gynecology;  Laterality: N/A;  . HYSTEROSCOPY WITH D & C N/A 02/27/2020   Procedure: DILATATION AND CURETTAGE /HYSTEROSCOPY;  Surgeon: Ward, Elenora Fender, MD;  Location: [facility] ORS;  Service: Gynecology;  Laterality: N/A;  . LAPAROSCOPIC APPENDECTOMY N/A 10/05/2017   Procedure: APPENDECTOMY LAPAROSCOPIC;  Surgeon: Herold Harms, MD;  Location: [facility] ORS;  Service: Gynecology;  Laterality: N/A;  . LAPAROSCOPIC LYSIS OF ADHESIONS N/A 10/05/2017   Procedure: LAPAROSCOPIC LYSIS OF ADHESIONS;  Surgeon: Herold Harms, MD;  Location: [facility] ORS;  Service: Gynecology;  Laterality: N/A;  . LAPAROSCOPIC OVARIAN CYSTECTOMY Right 10/05/2017   Procedure: LAPAROSCOPIC OVARIAN CYSTECTOMY;  Surgeon: Herold Harms, MD;  Location: [facility] ORS;  Service: Gynecology;  Laterality: Right;  .  LAPAROSCOPIC OVARIAN CYSTECTOMY N/A 08/08/2019   Procedure: LAPAROSCOPIC OVARIAN CYSTECTOMY;  Surgeon: Ward, Elenora Fenderhelsea C, MD;  Location: [facility] ORS;  Service: Gynecology;  Laterality: N/A;  . LAPAROSCOPIC SALPINGO OOPHERECTOMY Right 10/05/2017   Procedure: LAPAROSCOPIC SALPINGO OOPHORECTOMY;  Surgeon: Herold Harmsefrancesco, Martin A, MD;  Location: [facility] ORS;  Service: Gynecology;  Laterality: Right;   Social History   Socioeconomic History  . Marital status: Married    Spouse  name: Not on file  . Number of children: Not on file  . Years of education: Not on file  . Highest education level: Not on file  Occupational History  . Not on file  Tobacco Use  . Smoking status: Never Smoker  . Smokeless tobacco: Never Used  Vaping Use  . Vaping Use: Never used  Substance and Sexual Activity  . Alcohol use: Not Currently    Comment: socially  . Drug use: No  . Sexual activity: Yes  Other Topics Concern  . Not on file  Social History Narrative  . Not on file   Social Determinants of Health   Financial Resource Strain: Not on file  Food Insecurity: Not on file  Transportation Needs: Not on file  Physical Activity: Not on file  Stress: Not on file  Social Connections: Not on file  Intimate Partner Violence: Not on file   Family History  Problem Relation Age of Onset  . Breast cancer Maternal Grandmother   . Diabetes Maternal Grandmother   . Lung cancer Paternal Grandmother   . Lupus Paternal Grandmother   . Diabetes Mother   . Thyroid cancer Mother   . Colon cancer Other   . Cervical cancer Other   . Bipolar disorder Brother   . Alcohol abuse Brother   . Heart disease Neg Hx    Current Outpatient Medications on File Prior to Visit  Medication Sig  . aspirin-acetaminophen-caffeine (EXCEDRIN MIGRAINE) 250-250-65 MG tablet Take 2 tablets by mouth every 6 (six) hours as needed for headache.  . cetirizine-pseudoephedrine (ZYRTEC-D) 5-120 MG tablet Take 1 tablet by mouth daily as needed for allergies.  . fluticasone (FLONASE) 50 MCG/ACT nasal spray Place 1 spray into both nostrils daily as needed for allergies or rhinitis.  Marland Kitchen. FOLIC ACID PO Take 1 capsule by mouth daily.  Marland Kitchen. ibuprofen (ADVIL) 800 MG tablet Take 1 tablet (800 mg total) by mouth every 6 (six) hours.  . metFORMIN (GLUCOPHAGE) 500 MG tablet Take one tablet by mouth daily (Patient taking differently: Take 500 mg by mouth in the morning and at bedtime.)  . medroxyPROGESTERone (PROVERA) 10 MG  tablet Take 2 tablets (20 mg total) by mouth in the morning and at bedtime. (Patient not taking: Reported on 09/06/2020)  . Multiple Vitamin (MULTIVITAMIN WITH MINERALS) TABS tablet Take 1 tablet by mouth daily. (Patient not taking: Reported on 09/06/2020)   No current facility-administered medications on file prior to visit.    Review of Systems Per HPI unless specifically indicated above     Objective:    BP 119/67   Pulse 83   Ht 5\' 4"  (1.626 m)   Wt 259 lb 9.6 oz (117.8 kg)   SpO2 100%   BMI 44.56 kg/m   Wt Readings from Last 3 Encounters:  09/06/20 259 lb 9.6 oz (117.8 kg)  02/27/20 240 lb 4.8 oz (109 kg)  02/25/20 240 lb (108.9 kg)      Physical Exam Vitals and nursing note reviewed.  Constitutional:      General: She is not in  acute distress.    Appearance: She is well-developed. She is not diaphoretic.     Comments: Well-appearing, comfortable, cooperative  HENT:     Head: Normocephalic and atraumatic.  Eyes:     General:        Right eye: No discharge.        Left eye: No discharge.     Conjunctiva/sclera: Conjunctivae normal.  Neck:     Thyroid: No thyromegaly.  Cardiovascular:     Rate and Rhythm: Normal rate and regular rhythm.     Heart sounds: Normal heart sounds. No murmur heard.   Pulmonary:     Effort: Pulmonary effort is normal. No respiratory distress.     Breath sounds: Normal breath sounds. No wheezing or rales.  Musculoskeletal:        General: Normal range of motion.     Cervical back: Normal range of motion and neck supple.  Lymphadenopathy:     Cervical: No cervical adenopathy.  Skin:    General: Skin is warm and dry.     Findings: No erythema or rash.  Neurological:     Mental Status: She is alert and oriented to person, place, and time.  Psychiatric:        Behavior: Behavior normal.     Comments: Well groomed, good eye contact, normal speech and thoughts       Last pap undetermined date.  Component 2 yr ago  . Comment    Comment: Material submitted:                    .  endometrium - ENDOMETRIUM, BIOPSY   . Comment   Comment: Clinician provided ICD-10:  N92.6  N92.1   . Comment   Comment:   Diagnosis:  ENDOMETRIUM, BIOPSY:  COMPLEX HYPERPLASIA WITH ATYPIA.  COMMENT: This case has been reviewed in intradepartmental consultation and  the diagnosis reflects our consensus opinion.  OOD/09/13/2017  *   . Comment   Comment: Electronically signed:                   Marland Kitchen  Raynald Blend, MD, Pathologist   . Comment   Comment: Gross description:                     .  1 Container, formalin-filled, labeled with patient identification.  ENDOMETRIUM, BIOPSY:  Received in formalin is 2 X 3 X .2 cm in aggregate of tan material.  Tissue is submitted in toto in 1 cassette(s).  /SBB  /SBB   . Comment   Comment: Pathologist provided ICD-10:  N85.02   . Comment   Comment: CPT                            .  [id number]   Resulting Agency [facility]        Narrative Performed by: Verdell Carmine Performed at: [address]  [address], [place], [place] [id number]  Lab Director: Jolene Schimke MD, Phone: [phone]    Specimen Collected: 09/11/17 00:36         Results for orders placed or performed during the hospital encounter of 02/27/20  Surgical pathology  Result Value Ref Range   SURGICAL PATHOLOGY      SURGICAL PATHOLOGY CASE: [id number] PATIENT: Burton Apley Surgical Pathology Report     Specimen Submitted: A. Endometrial curettings  Clinical History: Thickened endometrium, dysfunctional uterine bleeding, history of endometrial hyperplasia  DIAGNOSIS: A. 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   GROSS DESCRIPTION: A. Labeled: Endometrial curettings Received: In formalin Tissue fragment(s): Multiple Size: 3.5 x 2.5 x 1.8  cm Description: Aggregate of dark red soft tissue and blood clot Entirely submitted in 6 cassettes.    Final Diagnosis performed by Georgeanna Harrison, MD.   Electronically signed 03/02/2020 10:02:33AM The electronic signature indicates that the named Attending Pathologist has evaluated the specimen Technical component performed at [facility], [address] [place], [place] [address] Lab: [phone] Dir: Jolene Schimke, MD, MMM  Professional component performed at [facility],  [facility], [address] [place], [place], [place] [address] Lab: [phone] Dir: Georgiann Cocker. Rubinas, MD       Assessment & Plan:   Problem List Items Addressed This Visit    PCOS (polycystic ovarian syndrome)   Morbid obesity with BMI of 40.0-44.9, adult (HCC)   Menorrhagia with irregular cycle   GAD (generalized anxiety disorder) - Primary   Relevant Medications   escitalopram (LEXAPRO) 10 MG tablet    Other Visit Diagnoses    Encounter to establish care with new doctor          Review patient prior records / history / GYN encounters.  PCOS Menorrhagia / abnormal cycles Followed by Dr Elesa Massed [facility] OBGYN Managed over prior years with medication and D&C procedures. Will review records.  Anxiety, generalized History consistent with chronic persistent problem, without acute panic attacks or other complications No obvious depression or mood disorder comorbid No prior medications Fam history anxiety/mood Trial on Escitalopram 10mg  daily #30 day supply with refills, follow-up 4 weeks, may consider adjust dose or add Buspar or other options. She declines therapy at this time.  [facility] Form Medical Evaluation  No restrictions. She is able to proceed as foster parent. Form completed and returned to patient.  Meds ordered this encounter  Medications  . escitalopram (LEXAPRO) 10 MG tablet    Sig: Take 1 tablet (10 mg total) by mouth daily.    Dispense:  30 tablet    Refill:  2      Follow up  plan: Return in about 4 weeks (around 10/04/2020) for 4-6 week virtual follow-up Anxiety medication.  10/06/2020, DO [facility] 09/06/2020, 10:29 AM

## 2020-09-22 ENCOUNTER — Ambulatory Visit (INDEPENDENT_AMBULATORY_CARE_PROVIDER_SITE_OTHER): Payer: BC Managed Care – PPO | Admitting: Family Medicine

## 2020-09-22 ENCOUNTER — Other Ambulatory Visit: Payer: Self-pay

## 2020-09-22 ENCOUNTER — Encounter: Payer: Self-pay | Admitting: Family Medicine

## 2020-09-22 VITALS — HR 113 | Ht 64.0 in | Wt 254.8 lb

## 2020-09-22 DIAGNOSIS — L739 Follicular disorder, unspecified: Secondary | ICD-10-CM

## 2020-09-22 DIAGNOSIS — F411 Generalized anxiety disorder: Secondary | ICD-10-CM | POA: Diagnosis not present

## 2020-09-22 MED ORDER — AMOXICILLIN-POT CLAVULANATE 875-125 MG PO TABS: 1.0000 | ORAL_TABLET | Freq: Two times a day (BID) | ORAL | 0 refills | Status: DC

## 2020-09-22 MED ORDER — MUPIROCIN 2 % EX OINT: 1.0000 "application " | TOPICAL_OINTMENT | Freq: Two times a day (BID) | CUTANEOUS | 1 refills | Status: DC | PRN

## 2020-09-22 MED ORDER — TRIAMCINOLONE ACETONIDE 0.5 % EX CREA: 1.0000 "application " | TOPICAL_CREAM | Freq: Two times a day (BID) | CUTANEOUS | 2 refills | Status: DC

## 2020-09-22 MED ORDER — BUSPIRONE HCL 10 MG PO TABS: 5.0000 mg | ORAL_TABLET | Freq: Two times a day (BID) | ORAL | 2 refills | Status: DC | PRN

## 2020-09-22 NOTE — Progress Notes (Signed)
Subjective:    Patient ID: Pamela Warren, female    DOB: 1991/01/02, 30 y.o.   MRN: [id number]  Pamela Warren is a 30 y.o. female presenting on 09/22/2020 for Rash and Anxiety   HPI   Folliculitis She had similar history of rash 1 year prior ago and she did remote telemdicine visit. They treated with antibiotic. It was rash under both arms similar. Now has flare up onset recently past few days to week with irritated red inflamed rash multiple spots, not draining and no boil Denies fever or spreading redness, bleeding ulceration  Generalized Anxiety Disorder - Last visit with me 09/06/20, for initial visit for same problem anxiety, treated with Escitalopram 10mg  daily new SSRI, see prior notes for background information. - Interval update with no improvement so far 2+ weeks on Escitalopram 10mg  daily - Today patient reports that the med she actually tried before was Buspar 10mg  with good results for her anxiety.  Depression screen [facility] 2/9 09/22/2020 09/06/2020 01/04/2018  Decreased Interest 0 0 0  Down, Depressed, Hopeless 0 0 0  PHQ - 2 Score 0 0 0  Altered sleeping 0 - -  Tired, decreased energy 1 - -  Change in appetite 0 - -  Feeling bad or failure about yourself  0 - -  Trouble concentrating 0 - -  Moving slowly or fidgety/restless 0 - -  Suicidal thoughts 0 - -  PHQ-9 Score 1 - -  Difficult doing work/chores Not difficult at all - -    Social History   Tobacco Use  . Smoking status: Never Smoker  . Smokeless tobacco: Never Used  Vaping Use  . Vaping Use: Never used  Substance Use Topics  . Alcohol use: Not Currently    Comment: socially  . Drug use: No    Review of Systems Per HPI unless specifically indicated above     Objective:    Pulse (!) 113   Ht 5\' 4"  (1.626 m)   Wt 254 lb 12.8 oz (115.6 kg)   SpO2 99%   BMI 43.74 kg/m   Wt Readings from Last 3 Encounters:  09/22/20 254 lb 12.8 oz (115.6 kg)  09/06/20 259 lb 9.6 oz (117.8 kg)  02/27/20 240  lb 4.8 oz (109 kg)    Physical Exam Vitals and nursing note reviewed.  Constitutional:      General: She is not in acute distress.    Appearance: She is well-developed. She is not diaphoretic.     Comments: Well-appearing, comfortable, cooperative  HENT:     Head: Normocephalic and atraumatic.  Eyes:     General:        Right eye: No discharge.        Left eye: No discharge.     Conjunctiva/sclera: Conjunctivae normal.  Cardiovascular:     Rate and Rhythm: Normal rate.  Pulmonary:     Effort: Pulmonary effort is normal.  Skin:    General: Skin is warm and dry.     Findings: No erythema or rash.  Neurological:     Mental Status: She is alert and oriented to person, place, and time.  Psychiatric:        Behavior: Behavior normal.     Comments: Well groomed, good eye contact, normal speech and thoughts    Results for orders placed or performed during the hospital encounter of 02/27/20  Surgical pathology  Result Value Ref Range   SURGICAL PATHOLOGY      SURGICAL  PATHOLOGY CASE: [id number] PATIENT: Burton Apley Surgical Pathology Report     Specimen Submitted: A. Endometrial curettings  Clinical History: Thickened endometrium, dysfunctional uterine bleeding, history of endometrial hyperplasia      DIAGNOSIS: A. 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   GROSS DESCRIPTION: A. Labeled: Endometrial curettings Received: In formalin Tissue fragment(s): Multiple Size: 3.5 x 2.5 x 1.8 cm Description: Aggregate of dark red soft tissue and blood clot Entirely submitted in 6 cassettes.    Final Diagnosis performed by Georgeanna Harrison, MD.   Electronically signed 03/02/2020 10:02:33AM The electronic signature indicates that the named Attending Pathologist has evaluated the specimen Technical component performed at [facility], [address] [place], [place] [address] Lab: [phone] Dir: Jolene Schimke, MD,  MMM  Professional component performed at [facility],  [facility], [address] [place], [place], [place] [address] Lab: [phone] Dir: Georgiann Cocker. Rubinas, MD       Assessment & Plan:   Problem List Items Addressed This Visit    GAD (generalized anxiety disorder)   Relevant Medications   busPIRone (BUSPAR) 10 MG tablet    Other Visit Diagnoses    Folliculitis    -  Primary   Relevant Medications   amoxicillin-clavulanate (AUGMENTIN) 875-125 MG tablet   triamcinolone cream (KENALOG) 0.5 %   mupirocin ointment (BACTROBAN) 2 %      Start Augmentin antibiotic now for folliculitis inflamed/infected hair follicles  Can add Triamincolone (Kenalog) topical stronger cortisone steroid twice a day as needed for redness inflammation irritation  In future, can use new rx Mupirocin (Bactroban) Ointment as a stronger topical antibiotic, at first notice of future recurrence of similar rash. Use for about a week to prevent problem.  Contact if recurrent infection.  -------------------- Anxiety  Added new rx today Buspar 10mg  tab, take half or whole once or twice as needed for anxiety  Continue Escitalopram 10mg  daily for now, no change, we can increase dose next time if needed or change it as well. May double to 20mg  if needed.  Also consider beta blocker in future.  Meds ordered this encounter  Medications  . amoxicillin-clavulanate (AUGMENTIN) 875-125 MG tablet    Sig: Take 1 tablet by mouth 2 (two) times daily.    Dispense:  20 tablet    Refill:  0  . triamcinolone cream (KENALOG) 0.5 %    Sig: Apply 1 application topically 2 (two) times daily. To affected areas, for up to 2 weeks.    Dispense:  30 g    Refill:  2  . mupirocin ointment (BACTROBAN) 2 %    Sig: Apply 1 application topically 2 (two) times daily as needed (folliculitis). For up to 7-10 days as needed if flare up.    Dispense:  22 g    Refill:  1  . busPIRone (BUSPAR) 10 MG tablet    Sig: Take 0.5-1  tablets (5-10 mg total) by mouth 2 (two) times daily as needed.    Dispense:  60 tablet    Refill:  2      Follow up plan: Return if symptoms worsen or fail to improve.   , DO [facility] 09/22/2020, 4:14 PM

## 2020-09-22 NOTE — Patient Instructions (Addendum)
Thank you for coming to the office today.  Start Augmentin antibiotic now for folliculitis inflamed/infected hair follicles  Can add Triamincolone (Kenalog) topical stronger cortisone steroid twice a day as needed for redness inflammation irritation  In future, can use Mupirocin (Bactroban) Ointment as a stronger topical antibiotic, at first notice of future recurrence of similar rash. Use for about a week. It is stronger than neosporin.  --------------------  Added Buspar 10mg  tab, take half or whole once or twice as needed for anxiety  Continue Escitalopram 10mg  daily for now, no change, we can increase dose next time if needed or change it as well.  Please schedule a Follow-up Appointment to: Return if symptoms worsen or fail to improve.  If you have any other questions or concerns, please feel free to call the office or send a message through MyChart. You may also schedule an earlier appointment if necessary.  Additionally, you may be receiving a survey about your experience at our office within a few days to 1 week by e-mail or mail. We value your feedback.  , DO [facility], Saralyn Pilar

## 2020-10-05 ENCOUNTER — Telehealth (INDEPENDENT_AMBULATORY_CARE_PROVIDER_SITE_OTHER): Payer: BC Managed Care – PPO | Admitting: Family Medicine

## 2020-10-05 ENCOUNTER — Encounter: Payer: Self-pay | Admitting: Family Medicine

## 2020-10-05 ENCOUNTER — Other Ambulatory Visit: Payer: Self-pay

## 2020-10-05 VITALS — Ht 64.0 in | Wt 254.0 lb

## 2020-10-05 DIAGNOSIS — L739 Follicular disorder, unspecified: Secondary | ICD-10-CM | POA: Diagnosis not present

## 2020-10-05 DIAGNOSIS — F411 Generalized anxiety disorder: Secondary | ICD-10-CM | POA: Diagnosis not present

## 2020-10-05 MED ORDER — DOXYCYCLINE HYCLATE 100 MG PO TABS: 100.0000 mg | ORAL_TABLET | Freq: Two times a day (BID) | ORAL | 0 refills | Status: DC

## 2020-10-05 MED ORDER — ESCITALOPRAM OXALATE 20 MG PO TABS: 20.0000 mg | ORAL_TABLET | Freq: Every day | ORAL | 1 refills | Status: DC

## 2020-10-05 NOTE — Patient Instructions (Addendum)
Start taking Doxycycline antibiotic 100mg  twice daily for 10 days. Take with full glass of water and stay upright for at least 30 min after taking, may be seated or standing, but should NOT lay down. This is just a safety precaution, if this medicine does not go all the way down throat well it could cause some burning discomfort to throat and esophagus.  Increased dose Escitalopram from 10 to 20mg  daily  Please schedule a Follow-up Appointment to: Return in about 3 months (around 01/05/2021) for 3 month for Follow-up Anxiety, Rash, Thyroid due for AM fasting labs.  If you have any other questions or concerns, please feel free to call the office or send a message through MyChart. You may also schedule an earlier appointment if necessary.  Additionally, you may be receiving a survey about your experience at our office within a few days to 1 week by e-mail or mail. We value your feedback.  , DO [facility], Saralyn Pilar

## 2020-10-05 NOTE — Progress Notes (Signed)
Virtual Visit via Telephone The purpose of this virtual visit is to provide medical care while limiting exposure to the novel coronavirus (COVID19) for both patient and office staff.  Consent was obtained for phone visit:  Yes.   Answered questions that patient had about telehealth interaction:  Yes.   I discussed the limitations, risks, security and privacy concerns of performing an evaluation and management service by telephone. I also discussed with the patient that there may be a patient responsible charge related to this service. The patient expressed understanding and agreed to proceed.  Patient Location: Home Provider Location: [facility] (Office)  Participants in virtual visit: - Patient: FATIMA FEDIE - CMA: Burnell Blanks, CMA - Provider: Dr Althea Charon  ---------------------------------------------------------------------- Chief Complaint  Patient presents with  . Anxiety  . Rash    S: Reviewed CMA documentation. I have called patient and gathered additional HPI as follows:   Folliculitis - Last visit with me 09/22/20, for initial visit for same problem with rash, treated with augmentin and triamcinolone cream and mupirocin, see prior notes for background information. - Interval update with improved rash folliculitis but seemed to have not resolved completely. Interested in repeat antibiotic or other therapy today. No abscess or drainage Denies fever or spreading redness, bleeding ulceration  History of Fam history Thyroid Fam history of thyroid cancer and thyroid problem. She asks about lab orders in future, will see GYN soon and they will likely check this but we can assist in future if needed.  Generalized Anxiety Disorder - Last visit with me 09/06/20 and 09/22/20 for initial visit for same problem anxiety, treated with Escitalopram 10mg  daily new SSRI and last visit also added Buspar as well, see prior notes for background information. Improved  results now with PRN buspar good result and also taking Escitalopram 10mg  daily asks about dose inc escitalopram  Denies any known or suspected exposure to person with or possibly with COVID19.  Denies any fevers, chills, sweats, body ache, cough, shortness of breath, sinus pain or pressure, headache, abdominal pain, diarrhea  Past Medical History:  Diagnosis Date  . Anxiety   . Complex endometrial hyperplasia    right  . Headache    stress/job related  . Heavy menstrual bleeding   . Infertility, female   . Ovarian cyst   . Ovarian cyst   . PCOS (polycystic ovarian syndrome)   . PONV (postoperative nausea and vomiting)    nausea and vomiting   Social History   Tobacco Use  . Smoking status: Never Smoker  . Smokeless tobacco: Never Used  Vaping Use  . Vaping Use: Never used  Substance Use Topics  . Alcohol use: Not Currently    Comment: socially  . Drug use: No    Current Outpatient Medications:  .  aspirin-acetaminophen-caffeine (EXCEDRIN MIGRAINE) 250-250-65 MG tablet, Take 2 tablets by mouth every 6 (six) hours as needed for headache., Disp: , Rfl:  .  busPIRone (BUSPAR) 10 MG tablet, Take 0.5-1 tablets (5-10 mg total) by mouth 2 (two) times daily as needed., Disp: 60 tablet, Rfl: 2 .  cetirizine-pseudoephedrine (ZYRTEC-D) 5-120 MG tablet, Take 1 tablet by mouth daily as needed for allergies., Disp: , Rfl:  .  doxycycline (VIBRA-TABS) 100 MG tablet, Take 1 tablet (100 mg total) by mouth 2 (two) times daily. For 10 days. Take with full glass of water, stay upright 30 min after taking., Disp: 20 tablet, Rfl: 0 .  escitalopram (LEXAPRO) 20 MG tablet, Take 1  tablet (20 mg total) by mouth daily., Disp: 90 tablet, Rfl: 1 .  fluticasone (FLONASE) 50 MCG/ACT nasal spray, Place 1 spray into both nostrils daily as needed for allergies or rhinitis., Disp: , Rfl:  .  FOLIC ACID PO, Take 1 capsule by mouth daily., Disp: , Rfl:  .  ibuprofen (ADVIL) 800 MG tablet, Take 1 tablet (800 mg  total) by mouth every 6 (six) hours., Disp: 45 tablet, Rfl: 1 .  medroxyPROGESTERone (PROVERA) 10 MG tablet, Take 2 tablets (20 mg total) by mouth in the morning and at bedtime., Disp: 60 tablet, Rfl: 7 .  metFORMIN (GLUCOPHAGE) 500 MG tablet, Take one tablet by mouth daily (Patient taking differently: Take 500 mg by mouth in the morning and at bedtime.), Disp: 60 tablet, Rfl: 1 .  Multiple Vitamin (MULTIVITAMIN WITH MINERALS) TABS tablet, Take 1 tablet by mouth daily., Disp: , Rfl:  .  mupirocin ointment (BACTROBAN) 2 %, Apply 1 application topically 2 (two) times daily as needed (folliculitis). For up to 7-10 days as needed if flare up., Disp: 22 g, Rfl: 1 .  triamcinolone cream (KENALOG) 0.5 %, Apply 1 application topically 2 (two) times daily. To affected areas, for up to 2 weeks., Disp: 30 g, Rfl: 2 .  megestrol (MEGACE) 40 MG tablet, Take 80 mg by mouth 2 (two) times daily., Disp: , Rfl:   Depression screen [facility] 2/9 09/22/2020 09/06/2020 01/04/2018  Decreased Interest 0 0 0  Down, Depressed, Hopeless 0 0 0  PHQ - 2 Score 0 0 0  Altered sleeping 0 - -  Tired, decreased energy 1 - -  Change in appetite 0 - -  Feeling bad or failure about yourself  0 - -  Trouble concentrating 0 - -  Moving slowly or fidgety/restless 0 - -  Suicidal thoughts 0 - -  PHQ-9 Score 1 - -  Difficult doing work/chores Not difficult at all - -    GAD 7 : Generalized Anxiety Score 09/22/2020 09/06/2020  Nervous, Anxious, on Edge 3 3  Control/stop worrying 3 2  Worry too much - different things 3 3  Trouble relaxing 3 3  Restless 2 2  Easily annoyed or irritable 1 0  Afraid - awful might happen 1 2  Total GAD 7 Score 16 15  Anxiety Difficulty Somewhat difficult Not difficult at all    -------------------------------------------------------------------------- O: No physical exam performed due to remote telephone encounter.  Lab results reviewed.  No results found for this or any previous visit (from the past  2160 hour(s)).  -------------------------------------------------------------------------- A&P:  Problem List Items Addressed This Visit    GAD (generalized anxiety disorder)   Relevant Medications   escitalopram (LEXAPRO) 20 MG tablet    Other Visit Diagnoses    Folliculitis    -  Primary   Relevant Medications   doxycycline (VIBRA-TABS) 100 MG tablet     Recurrence of folliculitis Completed augmentin Trial on Doxycycline counseling advised May use topical mupirocin if need Can refill Triamcinolone PRN  We can refer to Derm if unresolved  -------------------- Anxiety Improving Will increase Escitalopram from 10 to 20mg  now, and she can use Buspar PRN    Meds ordered this encounter  Medications  . doxycycline (VIBRA-TABS) 100 MG tablet    Sig: Take 1 tablet (100 mg total) by mouth 2 (two) times daily. For 10 days. Take with full glass of water, stay upright 30 min after taking.    Dispense:  20 tablet    Refill:  0  . escitalopram (LEXAPRO) 20 MG tablet    Sig: Take 1 tablet (20 mg total) by mouth daily.    Dispense:  90 tablet    Refill:  1    Dose increase    Follow-up: - Return in 3 months for Follow-up yearly visit with labs if needed fasting, and f/u anxiety  Patient verbalizes understanding with the above medical recommendations including the limitation of remote medical advice.  Specific follow-up and call-back criteria were given for patient to follow-up or seek medical care more urgently if needed.   - Time spent in direct consultation with patient on phone: 8 minutes   Saralyn Pilar, DO [facility] 10/05/2020, 4:36 PM

## 2020-10-20 ENCOUNTER — Telehealth: Payer: Self-pay

## 2020-10-20 NOTE — Telephone Encounter (Signed)
Copied from CRM [id number]. Topic: General - Other >> Oct 19, 2020  1:17 PM Mcneil, Ja-Kwan wrote: Reason for CRM: Pt would like to know if the paperwork for her to become a foster parent was scanned in to the system and if so she would like for it to be faxed or e-mailed. Pt stated she has not been given the fax # yet but she can call back with that info. Pt asked if paperwork was scanned to please e-mail it to her at: [email or web address] Cb# [phone]    4:30 - I have spoken with [name] and I have faxed over the paperwork to the DSS office. She is going to come by to pick up a copy of the paper tomorrow.

## 2020-12-10 ENCOUNTER — Other Ambulatory Visit: Payer: Self-pay | Admitting: Family Medicine

## 2020-12-10 DIAGNOSIS — F411 Generalized anxiety disorder: Secondary | ICD-10-CM

## 2020-12-10 NOTE — Telephone Encounter (Signed)
   Notes to clinic:   REQUEST FOR 90 DAYS PRESCRIPTION. DX Code Needed.  Requested Prescriptions  Pending Prescriptions Disp Refills   busPIRone (BUSPAR) 10 MG tablet [Pharmacy Med Name: BUSPIRONE HCL 10 MG TABLET] 180 tablet 1    Sig: Take 0.5-1 tablets (5-10 mg total) by mouth 2 (two) times daily as needed.      Psychiatry: Anxiolytics/Hypnotics - Non-controlled Passed - 12/10/2020  1:21 PM      Passed - Valid encounter within last 6 months    Recent Outpatient Visits           2 months ago Folliculitis   [facility], Netta Neat, DO   2 months ago Folliculitis   [facility] [place], Netta Neat, DO   3 months ago GAD (generalized anxiety disorder)   [facility], Netta Neat, DO

## 2021-07-31 IMAGING — US US PELVIS COMPLETE WITH TRANSVAGINAL
1 series · 13 of 25 positions shown · non-contrast
Comparison: 10/01/2017

CLINICAL DATA: Vaginal bleeding for 11 days, status post right
oophorectomy, history of known endometrial hyperplasia

EXAM:
TRANSABDOMINAL AND TRANSVAGINAL ULTRASOUND OF PELVIS
DOPPLER ULTRASOUND OF OVARIES
TECHNIQUE: Both transabdominal and transvaginal ultrasound examinations of the
pelvis were performed. Transabdominal technique was performed for
global imaging of the pelvis including uterus, ovaries, adnexal
regions, and pelvic cul-de-sac.
It was necessary to proceed with endovaginal exam following the
transabdominal exam to visualize the left ovary. Color and duplex
Doppler ultrasound was utilized to evaluate blood flow to the
ovaries.

[Series 1: us pelvis complete with transvaginal · 0.25mm/px · 91 acquisitions, 13 frames shown]
[im 1/91]
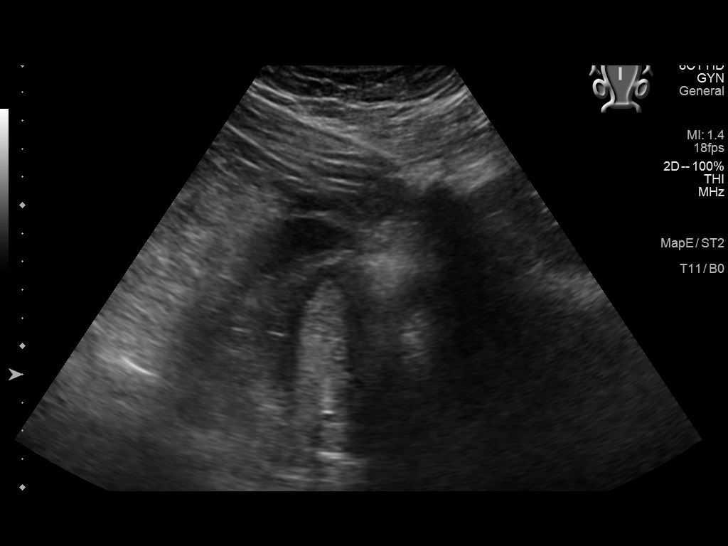
[im 8/91]
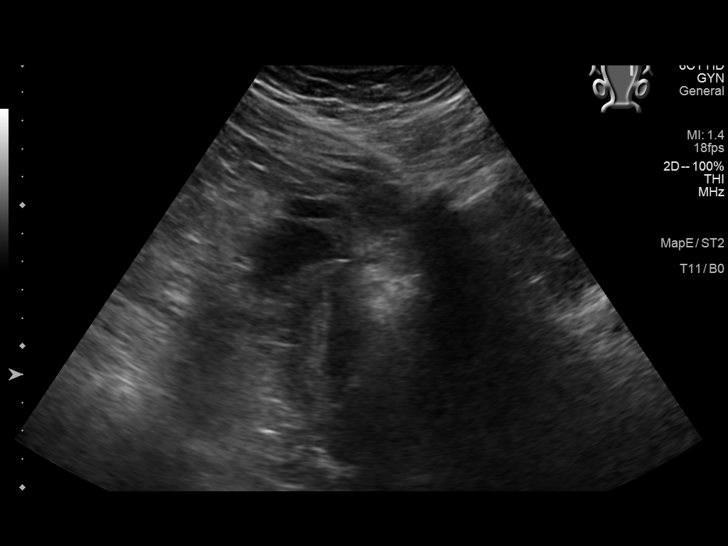
[im 16/91]
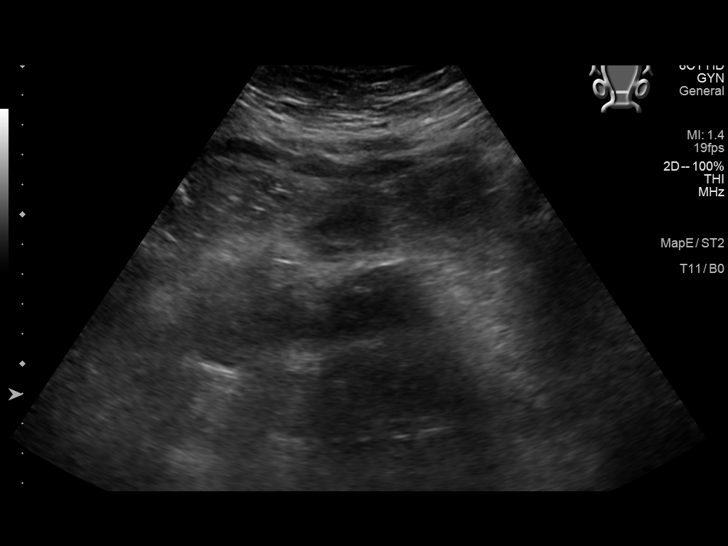
[im 23/91]
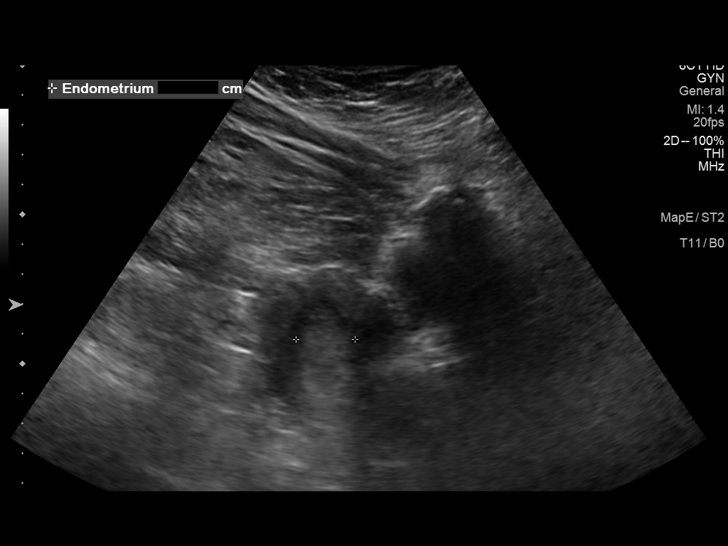
[im 31/91]
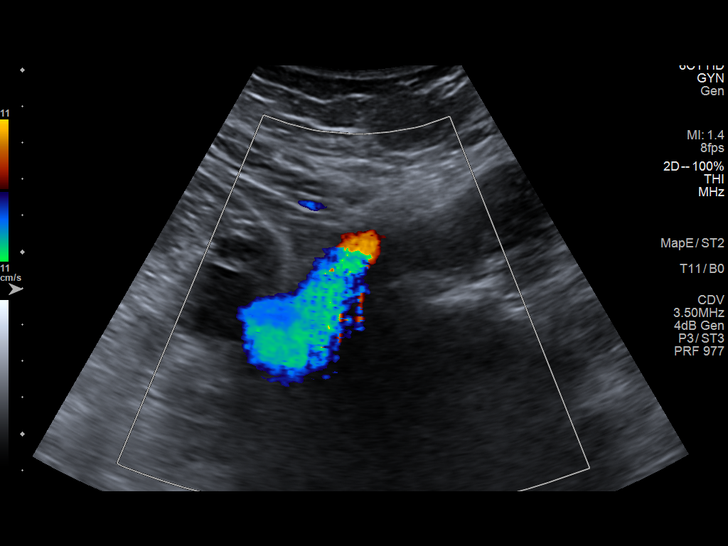
[im 38/91]
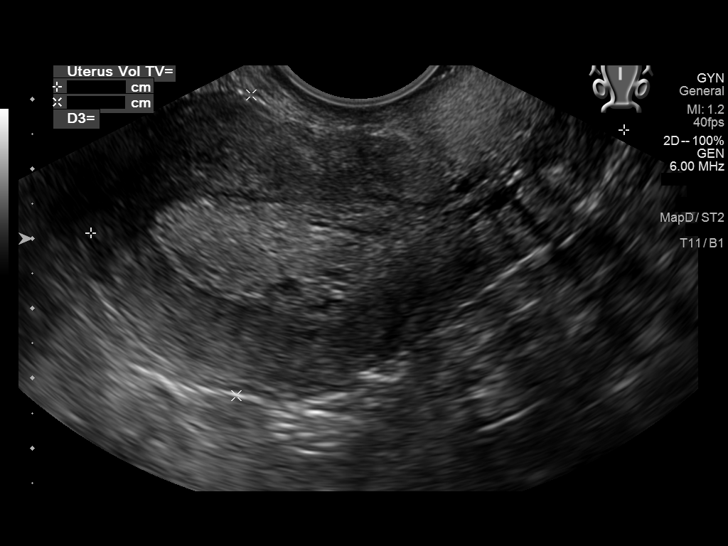
[im 46/91]
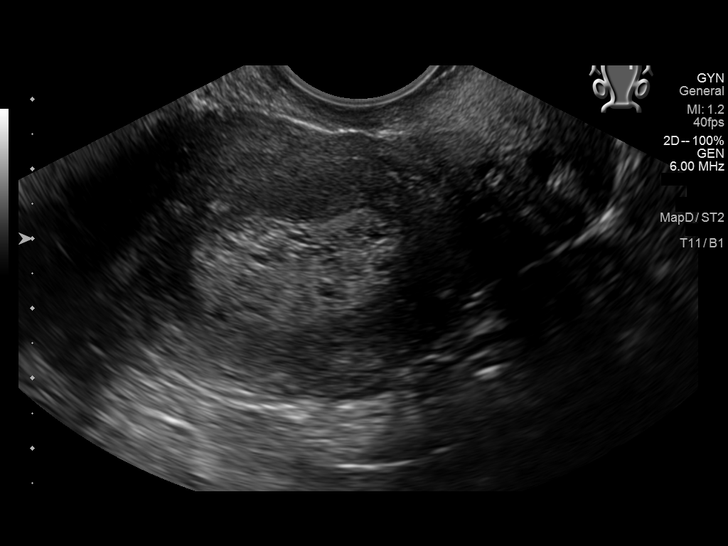
[im 53/91]
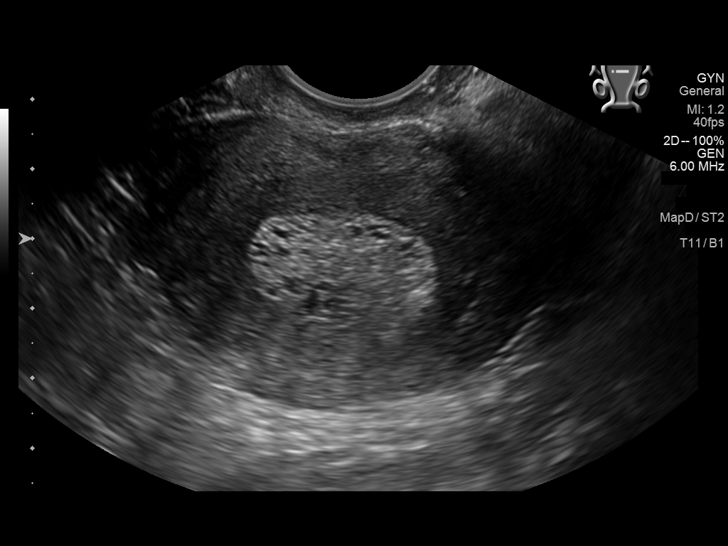
[im 61/91]
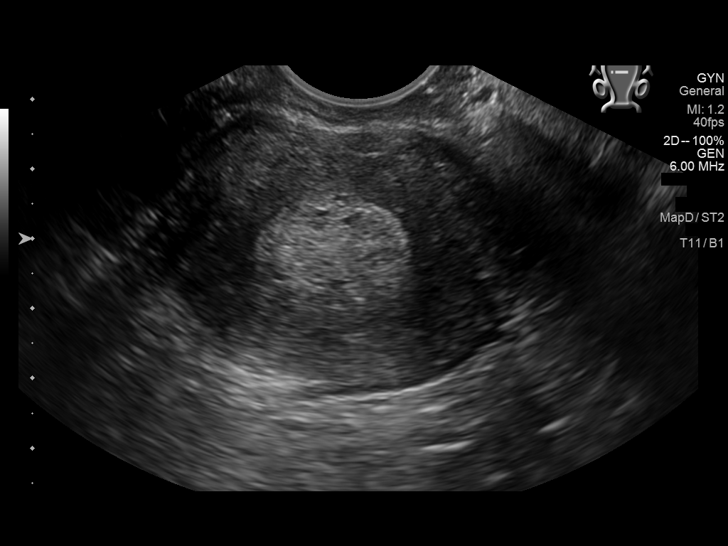
[im 68/91]
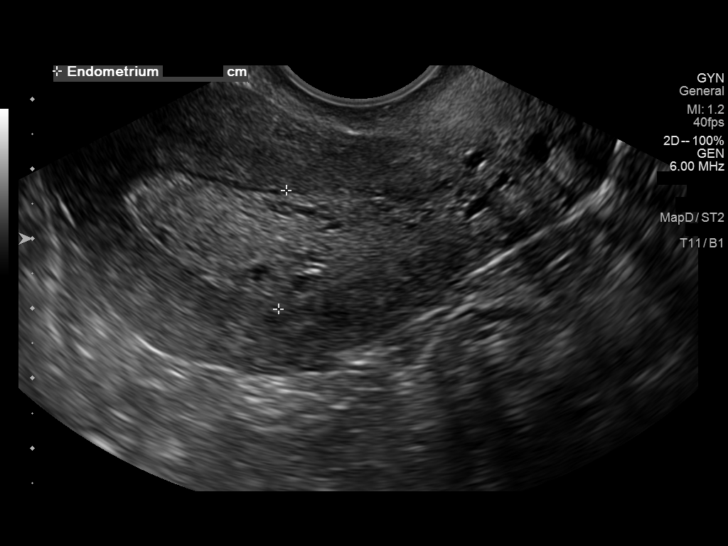
[im 76/91]
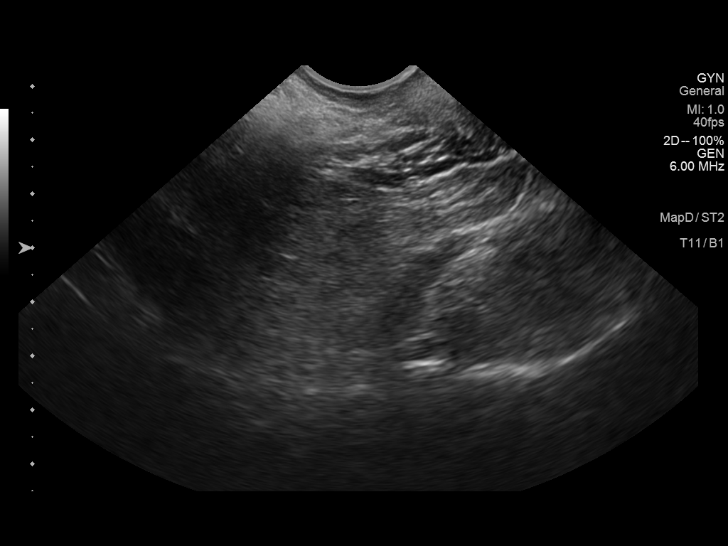
[im 83/91]
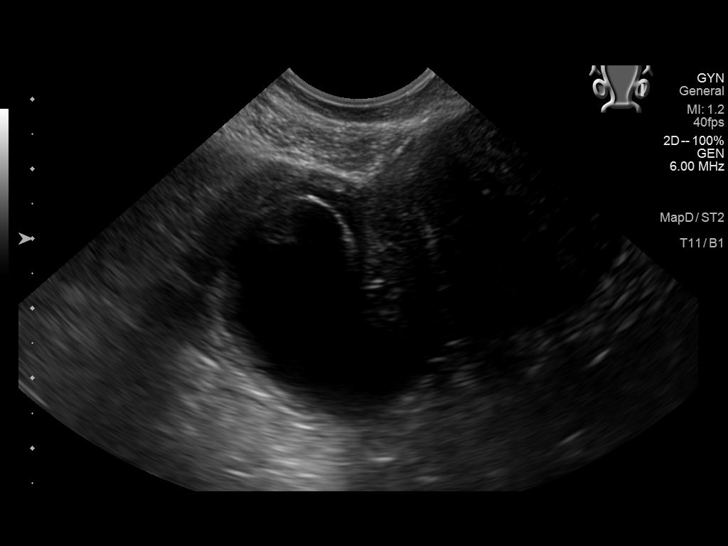
[im 91/91]
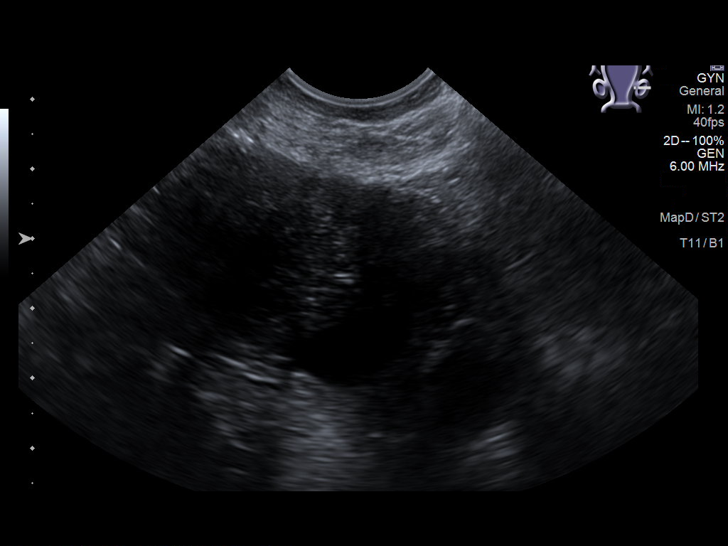

[13 of 25 positions shown; findings below may reference images not displayed]

FINDINGS: Uterus

Measurements: 7.8 x 4.3 x 5.4 cm = volume: 97 mL. No fibroids or
other mass visualized.

Endometrium

Thickness: 17 mm. There is a thickened, heterogeneous and slightly
echogenic appearing endometrium, as seen on prior examination.

Right ovary

Surgically absent.

Left ovary

Measurements: 3.8 x 3.6 x 3.1 cm = volume: 22 mL. There is a left
ovarian cyst measuring up to 3.5 cm which appears to have internal
debris or nodularity, new compared to prior ultrasound.

Pulsed Doppler evaluation of the left ovary demonstrates normal
low-resistance arterial and venous waveforms.

Other findings

No abnormal free fluid.
IMPRESSION: 1. There is a thickened, heterogeneous, and slightly echogenic
appearing endometrium, similar to that of prior examination and in
keeping with reported history of endometrial hyperplasia. Thickness
on current examination is 17 mm.

2. There is a left ovarian cyst measuring up to 3.5 cm which appears
to have internal debris or nodularity, new compared to prior
ultrasound. This is likely a hemorrhagic cyst or follicle given
patient age. Recommend follow-up examination in 6 weeks to establish
stability or resolution.

3.  Status post right oophorectomy.

## 2022-01-09 ENCOUNTER — Telehealth (INDEPENDENT_AMBULATORY_CARE_PROVIDER_SITE_OTHER): Payer: BC Managed Care – PPO | Admitting: Internal Medicine

## 2022-01-09 ENCOUNTER — Ambulatory Visit: Payer: Self-pay | Admitting: *Deleted

## 2022-01-09 ENCOUNTER — Encounter: Payer: Self-pay | Admitting: Internal Medicine

## 2022-01-09 DIAGNOSIS — J329 Chronic sinusitis, unspecified: Secondary | ICD-10-CM

## 2022-01-09 DIAGNOSIS — B9789 Other viral agents as the cause of diseases classified elsewhere: Secondary | ICD-10-CM | POA: Diagnosis not present

## 2022-01-09 MED ORDER — PREDNISONE 10 MG PO TABS: ORAL_TABLET | ORAL | 0 refills | Status: DC

## 2022-01-09 NOTE — Telephone Encounter (Signed)
2nd attempt to reach pt. "Call cannot be completed at this time."

## 2022-01-09 NOTE — Telephone Encounter (Signed)
Patient called, no answer, no mailbox set up. Patient had video visit today with provider, will close this encounter.   Summary: Sore throat, cough, pressure head advice   Pt is calling report Swollen lymnodes, sore throat, cough, pressure head. Her chest hurts when she coughs. No available appts. Please advise

## 2022-01-09 NOTE — Progress Notes (Signed)
Virtual Visit via Video Note  I connected with Pamela Warren on 01/09/22 at  1:00 PM EDT by a video enabled telemedicine application and verified that I am speaking with the correct person using two identifiers.  Location: Patient: Work Provider: Herbalist participating in this video call: Nicki Reaper, NP-C and Burton Apley.   I discussed the limitations of evaluation and management by telemedicine and the availability of in person appointments. The patient expressed understanding and agreed to proceed.  History of Present Illness:  Pt reports sinus pressure, runny nose, nasal congestion, ear fullness, sore throat and cough. This started 3 days ago.  She reports pressure around her nose and in her cheeks.  She is blowing clear mucous out of her nose. She denies difficulty swallowing. The cough has been nonproductive. She denies fever, chills or body aches. She has tried Tylenol Cold and Flu, Robitussin with minimal relief of symptoms. She has had sick contacts with similar symptoms.  She reports she typically has allergies but at this time of year.   Past Medical History:  Diagnosis Date   Anxiety    Complex endometrial hyperplasia    right   Headache    stress/job related   Heavy menstrual bleeding    Infertility, female    Ovarian cyst    Ovarian cyst    PCOS (polycystic ovarian syndrome)    PONV (postoperative nausea and vomiting)    nausea and vomiting    Current Outpatient Medications  Medication Sig Dispense Refill   aspirin-acetaminophen-caffeine (EXCEDRIN MIGRAINE) 250-250-65 MG tablet Take 2 tablets by mouth every 6 (six) hours as needed for headache.     busPIRone (BUSPAR) 10 MG tablet TAKE 0.5-1 TABLETS (5-10 MG TOTAL) BY MOUTH 2 (TWO) TIMES DAILY AS NEEDED. 180 tablet 1   cetirizine-pseudoephedrine (ZYRTEC-D) 5-120 MG tablet Take 1 tablet by mouth daily as needed for allergies.     doxycycline (VIBRA-TABS) 100 MG tablet Take 1 tablet (100 mg total) by  mouth 2 (two) times daily. For 10 days. Take with full glass of water, stay upright 30 min after taking. 20 tablet 0   escitalopram (LEXAPRO) 20 MG tablet Take 1 tablet (20 mg total) by mouth daily. 90 tablet 1   fluticasone (FLONASE) 50 MCG/ACT nasal spray Place 1 spray into both nostrils daily as needed for allergies or rhinitis.     FOLIC ACID PO Take 1 capsule by mouth daily.     ibuprofen (ADVIL) 800 MG tablet Take 1 tablet (800 mg total) by mouth every 6 (six) hours. 45 tablet 1   medroxyPROGESTERone (PROVERA) 10 MG tablet Take 2 tablets (20 mg total) by mouth in the morning and at bedtime. 60 tablet 7   megestrol (MEGACE) 40 MG tablet Take 80 mg by mouth 2 (two) times daily.     metFORMIN (GLUCOPHAGE) 500 MG tablet Take one tablet by mouth daily (Patient taking differently: Take 500 mg by mouth in the morning and at bedtime.) 60 tablet 1   Multiple Vitamin (MULTIVITAMIN WITH MINERALS) TABS tablet Take 1 tablet by mouth daily.     mupirocin ointment (BACTROBAN) 2 % Apply 1 application topically 2 (two) times daily as needed (folliculitis). For up to 7-10 days as needed if flare up. 22 g 1   triamcinolone cream (KENALOG) 0.5 % Apply 1 application topically 2 (two) times daily. To affected areas, for up to 2 weeks. 30 g 2   No current facility-administered medications for this visit.  Allergies  Allergen Reactions   Other Anaphylaxis    Tree Nuts    Family History  Problem Relation Age of Onset   Breast cancer Maternal Grandmother    Diabetes Maternal Grandmother    Lung cancer Paternal Grandmother    Lupus Paternal Grandmother    Diabetes Mother    Thyroid cancer Mother    Colon cancer Other    Cervical cancer Other    Bipolar disorder Brother    Alcohol abuse Brother    Heart disease Neg Hx     Social History   Socioeconomic History   Marital status: Married    Spouse name: Not on file   Number of children: Not on file   Years of education: Not on file   Highest  education level: Not on file  Occupational History   Not on file  Tobacco Use   Smoking status: Never   Smokeless tobacco: Never  Vaping Use   Vaping Use: Never used  Substance and Sexual Activity   Alcohol use: Not Currently    Comment: socially   Drug use: No   Sexual activity: Yes  Other Topics Concern   Not on file  Social History Narrative   Not on file   Social Determinants of Health   Financial Resource Strain: Not on file  Food Insecurity: Not on file  Transportation Needs: Not on file  Physical Activity: Not on file  Stress: Not on file  Social Connections: Not on file  Intimate Partner Violence: Not on file     Constitutional: Denies fever, malaise, fatigue, headache or abrupt weight changes.  HEENT: Patient reports runny nose, nasal congestion, ear fullness and sore throat.  Denies eye pain, eye redness, ear pain, ringing in the ears, wax buildup, bloody nose. Respiratory: Patient reports cough.  Denies difficulty breathing, shortness of breath,  or sputum production.   Cardiovascular: Denies chest pain, chest tightness, palpitations or swelling in the hands or feet.  Gastrointestinal: Denies abdominal pain, bloating, constipation, diarrhea or blood in the stool.   No other specific complaints in a complete review of systems (except as listed in HPI above).    Observations/Objective:  There were no vitals taken for this visit. Wt Readings from Last 3 Encounters:  10/05/20 254 lb (115.2 kg)  09/22/20 254 lb 12.8 oz (115.6 kg)  09/06/20 259 lb 9.6 oz (117.8 kg)    General: Appears her stated age, in NAD. HEENT: Head: normal shape and size, she points to the maxillary as the site of her; Nose: Congestion noted.; Throat/Mouth: Hoarseness noted. Pulmonary/Chest: Normal effort. No respiratory distress. Neurological: Alert and oriented.   BMET    Component Value Date/Time   NA 137 02/26/2020 0943   NA 138 08/20/2013 1043   K 4.0 02/26/2020 0943   K 4.0  08/20/2013 1043   CL 104 02/26/2020 0943   CL 106 08/20/2013 1043   CO2 23 02/26/2020 0943   CO2 28 08/20/2013 1043   GLUCOSE 89 02/26/2020 0943   GLUCOSE 84 08/20/2013 1043   BUN 11 02/26/2020 0943   BUN 13 08/20/2013 1043   CREATININE 0.61 02/26/2020 0943   CREATININE 0.66 08/20/2013 1043   CALCIUM 9.1 02/26/2020 0943   CALCIUM 9.0 08/20/2013 1043   GFRNONAA >60 02/26/2020 0943   GFRNONAA >60 08/20/2013 1043   GFRAA >60 08/07/2019 0822   GFRAA >60 08/20/2013 1043    Lipid Panel  No results found for: "CHOL", "TRIG", "HDL", "CHOLHDL", "VLDL", "LDLCALC"  CBC  Component Value Date/Time   WBC 8.8 02/26/2020 0943   RBC 4.50 02/26/2020 0943   HGB 12.8 02/26/2020 0943   HGB 10.3 (L) 08/20/2013 1043   HCT 38.4 02/26/2020 0943   HCT 31.5 (L) 08/20/2013 1043   PLT 302 02/26/2020 0943   PLT 358 08/20/2013 1043   MCV 85.3 02/26/2020 0943   MCV 78 (L) 08/20/2013 1043   MCH 28.4 02/26/2020 0943   MCHC 33.3 02/26/2020 0943   RDW 13.2 02/26/2020 0943   RDW 15.5 (H) 08/20/2013 1043   LYMPHSABS 1.8 10/05/2017 1140   LYMPHSABS 1.7 01/11/2012 2052   MONOABS 0.7 10/05/2017 1140   MONOABS 1.2 (H) 01/11/2012 2052   EOSABS 0.1 10/05/2017 1140   EOSABS 0.1 01/11/2012 2052   BASOSABS 0.0 10/05/2017 1140   BASOSABS 0.0 01/11/2012 2052    Hgb A1C No results found for: "HGBA1C"      Assessment and Plan:  Viral Sinusitis:  Can try Nettie pot which can be purchased from your local pharmacy Encourage rest and fluids Rx for Pred taper for symptom management No indication for antibiotics at this time  Follow Up Instructions:    I discussed the assessment and treatment plan with the patient. The patient was provided an opportunity to ask questions and all were answered. The patient agreed with the plan and demonstrated an understanding of the instructions.   The patient was advised to call back or seek an in-person evaluation if the symptoms worsen or if the condition fails to  improve as anticipated.    Nicki Reaper, NP

## 2022-01-09 NOTE — Telephone Encounter (Signed)
This encounter was created in error - please disregard.

## 2022-01-09 NOTE — Telephone Encounter (Signed)
Pt is calling report Swollen lymnodes, sore throat, cough, pressure head. Her chest hurts when she coughs. No available appts. Please advise

## 2022-01-09 NOTE — Patient Instructions (Signed)

## 2022-09-07 ENCOUNTER — Other Ambulatory Visit: Payer: Self-pay

## 2022-10-13 ENCOUNTER — Other Ambulatory Visit: Payer: Self-pay | Admitting: Family Medicine

## 2022-11-29 ENCOUNTER — Other Ambulatory Visit: Payer: Self-pay | Admitting: Obstetrics and Gynecology

## 2022-11-29 DIAGNOSIS — N8502 Endometrial intraepithelial neoplasia [EIN]: Secondary | ICD-10-CM

## 2022-11-29 DIAGNOSIS — N939 Abnormal uterine and vaginal bleeding, unspecified: Secondary | ICD-10-CM

## 2022-11-30 ENCOUNTER — Ambulatory Visit
Admission: RE | Admit: 2022-11-30 | Discharge: 2022-11-30 | Disposition: A | Discharge status: Home / Self Care | Payer: BC Managed Care – PPO | Source: Ambulatory Visit | Attending: Obstetrics and Gynecology | Admitting: Obstetrics and Gynecology

## 2022-11-30 DIAGNOSIS — N8502 Endometrial intraepithelial neoplasia [EIN]: Secondary | ICD-10-CM

## 2022-11-30 DIAGNOSIS — N939 Abnormal uterine and vaginal bleeding, unspecified: Secondary | ICD-10-CM

## 2023-01-05 ENCOUNTER — Other Ambulatory Visit: Payer: Self-pay | Admitting: Obstetrics and Gynecology

## 2023-02-07 ENCOUNTER — Encounter
Admission: RE | Admit: 2023-02-07 | Discharge: 2023-02-07 | Disposition: A | Discharge status: Home / Self Care | Payer: BC Managed Care – PPO | Source: Ambulatory Visit | Attending: Obstetrics and Gynecology | Admitting: Obstetrics and Gynecology

## 2023-02-07 VITALS — Ht 64.0 in | Wt 275.6 lb

## 2023-02-07 DIAGNOSIS — Z01812 Encounter for preprocedural laboratory examination: Secondary | ICD-10-CM

## 2023-02-07 DIAGNOSIS — Z01818 Encounter for other preprocedural examination: Secondary | ICD-10-CM

## 2023-02-07 HISTORY — DX: Endometrial intraepithelial neoplasia (EIN): N85.02

## 2023-02-07 HISTORY — DX: Morbid (severe) obesity due to excess calories: E66.01

## 2023-02-07 NOTE — Pre-Procedure Instructions (Signed)
Did pts preop interview. Told pt we needed her to come in for bmp (morbid obesity) per anesthesia guidelines. 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 Will have to do labs day of surgery

## 2023-02-07 NOTE — Patient Instructions (Addendum)
Your procedure is scheduled on:02-16-23 Friday Report to the Registration Desk on the 1st floor of the [facility].Then proceed to the 2nd floor Surgery Desk To find out your arrival time, please call [phone] between 1PM - 3PM on:02-15-23 Thursday If your arrival time is 6:00 am, do not arrive before that time as the [facility] entrance doors do not open until 6:00 am.  REMEMBER: Instructions that are not followed completely may result in serious medical risk, up to and including death; or upon the discretion of your surgeon and anesthesiologist your surgery may need to be rescheduled.  Do not eat food after midnight the night before surgery.  No gum chewing or hard candies.  You may however, drink CLEAR liquids up to 2 hours before you are scheduled to arrive for your surgery. Do not drink anything within 2 hours of your scheduled arrival time.  Clear liquids include: - water  - apple juice without pulp - gatorade (not RED colors) - black coffee or tea (Do NOT add milk or creamers to the coffee or tea) Do NOT drink anything that is not on this list.  One week prior to surgery:Last dose will be on 02-08-23 Stop Anti-inflammatories (NSAIDS) such as Advil, Aleve, Ibuprofen, Motrin, Naproxen, Naprosyn and Aspirin based products such as Excedrin, Goody's Powder, BC Powder.You may however, take Tylenol if needed for pain up until the day of surgery. Stop ANY OVER THE COUNTER supplements/vitamins 7 days prior to surgery (Multivitamin)   Continue taking all prescribed medications   TAKE ONLY THESE MEDICATIONS THE MORNING OF SURGERY WITH A SIP OF WATER: -escitalopram (LEXAPRO)   No Alcohol for 24 hours before or after surgery.  No Smoking including e-cigarettes for 24 hours before surgery.  No chewable tobacco products for at least 6 hours before surgery.  No nicotine patches on the day of surgery.  Do not use any "recreational" drugs for at least a week (preferably 2 weeks) before  your surgery.  Please be advised that the combination of cocaine and anesthesia may have negative outcomes, up to and including death. If you test positive for cocaine, your surgery will be cancelled.  On the morning of surgery brush your teeth with toothpaste and water, you may rinse your mouth with mouthwash if you wish. Do not swallow any toothpaste or mouthwash.  Do not wear jewelry, make-up, hairpins, clips or nail polish.  For welded (permanent) jewelry: bracelets, anklets, waist bands, etc.  Please have this removed prior to surgery.  If it is not removed, there is a chance that hospital personnel will need to cut it off on the day of surgery.  Do not wear lotions, powders, or perfumes.   Do not shave body hair from the neck down 48 hours before surgery.  Contact lenses, hearing aids and dentures may not be worn into surgery.  Do not bring valuables to the hospital. [facility] is not responsible for any missing/lost belongings or valuables.   Notify your doctor if there is any change in your medical condition (cold, fever, infection).  Wear comfortable clothing (specific to your surgery type) to the hospital.  After surgery, you can help prevent lung complications by doing breathing exercises.  Take deep breaths and cough every 1-2 hours. Your doctor may order a device called an Incentive Spirometer to help you take deep breaths. When coughing or sneezing, hold a pillow firmly against your incision with both hands. This is called "splinting." Doing this helps protect your incision. It  also decreases belly discomfort.  If you are being admitted to the hospital overnight, leave your suitcase in the car. After surgery it may be brought to your room.  In case of increased patient census, it may be necessary for you, the patient, to continue your postoperative care in the Same Day Surgery department.  If you are being discharged the day of surgery, you will not be allowed to drive  home. You will need a responsible individual to drive you home and stay with you for 24 hours after surgery.   If you are taking public transportation, you will need to have a responsible individual with you.  Please call the Pre-admissions Testing Dept. at [phone] if you have any questions about these instructions.  Surgery Visitation Policy:  Patients having surgery or a procedure may have two visitors.  Children under the age of 48 must have an adult with them who is not the patient.

## 2023-02-15 MED ORDER — LACTATED RINGERS IV SOLN: INTRAVENOUS | Status: DC

## 2023-02-15 MED ORDER — ORAL CARE MOUTH RINSE: 15.0000 mL | Freq: Once | OROMUCOSAL | Status: AC

## 2023-02-15 MED ORDER — FAMOTIDINE 20 MG PO TABS
20.0000 mg | ORAL_TABLET | Freq: Once | ORAL | Status: AC
  Administered 2023-02-16: 20 mg via ORAL

## 2023-02-15 MED ORDER — CHLORHEXIDINE GLUCONATE 0.12 % MT SOLN
15.0000 mL | Freq: Once | OROMUCOSAL | Status: AC
  Administered 2023-02-16: 15 mL via OROMUCOSAL

## 2023-02-16 ENCOUNTER — Encounter
Admission: RE | Disposition: A | Discharge status: Home / Self Care | Payer: Self-pay | Source: Home / Self Care | Attending: Obstetrics and Gynecology

## 2023-02-16 ENCOUNTER — Ambulatory Visit: Payer: BC Managed Care – PPO | Admitting: Urgent Care

## 2023-02-16 ENCOUNTER — Ambulatory Visit
Admission: RE | Admit: 2023-02-16 | Discharge: 2023-02-16 | Disposition: A | Discharge status: Home / Self Care | Payer: BC Managed Care – PPO | Attending: Obstetrics and Gynecology | Admitting: Obstetrics and Gynecology

## 2023-02-16 ENCOUNTER — Other Ambulatory Visit: Payer: Self-pay

## 2023-02-16 ENCOUNTER — Ambulatory Visit: Payer: BC Managed Care – PPO | Admitting: Anesthesiology

## 2023-02-16 ENCOUNTER — Encounter: Payer: Self-pay | Admitting: Obstetrics and Gynecology

## 2023-02-16 DIAGNOSIS — Z6841 Body Mass Index (BMI) 40.0 and over, adult: Secondary | ICD-10-CM | POA: Diagnosis not present

## 2023-02-16 DIAGNOSIS — R519 Headache, unspecified: Secondary | ICD-10-CM | POA: Diagnosis not present

## 2023-02-16 DIAGNOSIS — F411 Generalized anxiety disorder: Secondary | ICD-10-CM

## 2023-02-16 DIAGNOSIS — N8502 Endometrial intraepithelial neoplasia [EIN]: Secondary | ICD-10-CM | POA: Diagnosis present

## 2023-02-16 DIAGNOSIS — E282 Polycystic ovarian syndrome: Secondary | ICD-10-CM | POA: Insufficient documentation

## 2023-02-16 DIAGNOSIS — Z01812 Encounter for preprocedural laboratory examination: Secondary | ICD-10-CM

## 2023-02-16 DIAGNOSIS — F419 Anxiety disorder, unspecified: Secondary | ICD-10-CM | POA: Insufficient documentation

## 2023-02-16 DIAGNOSIS — Z01818 Encounter for other preprocedural examination: Secondary | ICD-10-CM

## 2023-02-16 HISTORY — PX: HYSTEROSCOPY WITH D & C: SHX1775

## 2023-02-16 LAB — BASIC METABOLIC PANEL
Anion gap: 9 (ref 5–15)
BUN: 11 mg/dL (ref 6–20)
CO2: 21 mmol/L — ABNORMAL LOW (ref 22–32)
Calcium: 9.2 mg/dL (ref 8.9–10.3)
Chloride: 107 mmol/L (ref 98–111)
Creatinine, Ser: 0.6 mg/dL (ref 0.44–1.00)
GFR, Estimated: >60 mL/min (ref >60)
Glucose, Bld: 98 mg/dL (ref 70–99)
Potassium: 3.9 mmol/L (ref 3.5–5.1)
Sodium: 137 mmol/L (ref 135–145)

## 2023-02-16 LAB — POCT PREGNANCY, URINE: Preg Test, Ur: NEGATIVE

## 2023-02-16 SURGERY — DILATATION AND CURETTAGE /HYSTEROSCOPY
Anesthesia: General

## 2023-02-16 MED ORDER — 0.9 % SODIUM CHLORIDE (POUR BTL) OPTIME
TOPICAL | Status: DC | PRN
  Administered 2023-02-16: 500 mL

## 2023-02-16 MED ORDER — PROPOFOL 10 MG/ML IV BOLUS
INTRAVENOUS | Status: AC
  Filled 2023-02-16: qty 20

## 2023-02-16 MED ORDER — GLYCOPYRROLATE 0.2 MG/ML IJ SOLN
INTRAMUSCULAR | Status: DC | PRN
  Administered 2023-02-16: .2 mg via INTRAVENOUS

## 2023-02-16 MED ORDER — ONDANSETRON HCL 4 MG/2ML IJ SOLN
INTRAMUSCULAR | Status: DC | PRN
Start: 2023-02-16 — End: 2023-02-16
  Administered 2023-02-16: 4 mg via INTRAVENOUS

## 2023-02-16 MED ORDER — CHLORHEXIDINE GLUCONATE 0.12 % MT SOLN
OROMUCOSAL | Status: AC
  Filled 2023-02-16: qty 15

## 2023-02-16 MED ORDER — ONDANSETRON HCL 4 MG/2ML IJ SOLN
INTRAMUSCULAR | Status: AC
  Filled 2023-02-16: qty 4

## 2023-02-16 MED ORDER — PROPOFOL 10 MG/ML IV BOLUS
INTRAVENOUS | Status: DC | PRN
Start: 2023-02-16 — End: 2023-02-16
  Administered 2023-02-16: 200 mg via INTRAVENOUS
  Administered 2023-02-16: 150 ug/kg/min via INTRAVENOUS
  Administered 2023-02-16: 50 mg via INTRAVENOUS

## 2023-02-16 MED ORDER — FENTANYL CITRATE (PF) 100 MCG/2ML IJ SOLN
INTRAMUSCULAR | Status: AC
  Filled 2023-02-16: qty 2

## 2023-02-16 MED ORDER — PROPOFOL 1000 MG/100ML IV EMUL
INTRAVENOUS | Status: AC
  Filled 2023-02-16: qty 100

## 2023-02-16 MED ORDER — ACETAMINOPHEN 10 MG/ML IV SOLN
INTRAVENOUS | Status: AC
  Filled 2023-02-16: qty 100

## 2023-02-16 MED ORDER — DEXAMETHASONE SODIUM PHOSPHATE 10 MG/ML IJ SOLN
INTRAMUSCULAR | Status: DC | PRN
  Administered 2023-02-16: 10 mg via INTRAVENOUS

## 2023-02-16 MED ORDER — MIDAZOLAM HCL 2 MG/2ML IJ SOLN
INTRAMUSCULAR | Status: AC
  Filled 2023-02-16: qty 2

## 2023-02-16 MED ORDER — FENTANYL CITRATE (PF) 100 MCG/2ML IJ SOLN: 25.0000 ug | INTRAMUSCULAR | Status: DC | PRN

## 2023-02-16 MED ORDER — OXYCODONE HCL 5 MG PO TABS
5.0000 mg | ORAL_TABLET | Freq: Once | ORAL | Status: AC | PRN
  Administered 2023-02-16: 5 mg via ORAL

## 2023-02-16 MED ORDER — FENTANYL CITRATE (PF) 100 MCG/2ML IJ SOLN
INTRAMUSCULAR | Status: DC | PRN
  Administered 2023-02-16 (×2): 50 ug via INTRAVENOUS

## 2023-02-16 MED ORDER — MIDAZOLAM HCL 2 MG/2ML IJ SOLN
INTRAMUSCULAR | Status: DC | PRN
  Administered 2023-02-16: 2 mg via INTRAVENOUS

## 2023-02-16 MED ORDER — SUCCINYLCHOLINE CHLORIDE 200 MG/10ML IV SOSY
PREFILLED_SYRINGE | INTRAVENOUS | Status: AC
  Filled 2023-02-16: qty 10

## 2023-02-16 MED ORDER — OXYCODONE HCL 5 MG/5ML PO SOLN: 5.0000 mg | Freq: Once | ORAL | Status: AC | PRN

## 2023-02-16 MED ORDER — SUCCINYLCHOLINE CHLORIDE 200 MG/10ML IV SOSY
PREFILLED_SYRINGE | INTRAVENOUS | Status: DC | PRN
  Administered 2023-02-16: 200 mg via INTRAVENOUS

## 2023-02-16 MED ORDER — IBUPROFEN 600 MG PO TABS
600.0000 mg | ORAL_TABLET | Freq: Four times a day (QID) | ORAL | 0 refills | Status: DC | PRN
Start: 2023-02-16 — End: 2023-05-22

## 2023-02-16 MED ORDER — FAMOTIDINE 20 MG PO TABS
ORAL_TABLET | ORAL | Status: AC
  Filled 2023-02-16: qty 1

## 2023-02-16 MED ORDER — ACETAMINOPHEN 10 MG/ML IV SOLN
INTRAVENOUS | Status: DC | PRN
  Administered 2023-02-16: 1000 mg via INTRAVENOUS

## 2023-02-16 MED ORDER — OXYCODONE HCL 5 MG PO TABS
ORAL_TABLET | ORAL | Status: AC
  Filled 2023-02-16: qty 1

## 2023-02-16 MED ORDER — SILVER NITRATE-POT NITRATE 75-25 % EX MISC
CUTANEOUS | Status: DC | PRN
  Administered 2023-02-16: 2

## 2023-02-16 MED ORDER — KETOROLAC TROMETHAMINE 30 MG/ML IJ SOLN
INTRAMUSCULAR | Status: DC | PRN
  Administered 2023-02-16: 30 mg via INTRAVENOUS

## 2023-02-16 MED ORDER — LIDOCAINE HCL (PF) 2 % IJ SOLN
INTRAMUSCULAR | Status: AC
  Filled 2023-02-16: qty 10

## 2023-02-16 MED ORDER — SILVER NITRATE-POT NITRATE 75-25 % EX MISC
CUTANEOUS | Status: AC
  Filled 2023-02-16: qty 10

## 2023-02-16 MED ORDER — SODIUM CHLORIDE 0.9 % IR SOLN
Status: DC | PRN
  Administered 2023-02-16: 3000 mL

## 2023-02-16 MED ORDER — DEXAMETHASONE SODIUM PHOSPHATE 10 MG/ML IJ SOLN
INTRAMUSCULAR | Status: AC
  Filled 2023-02-16: qty 2

## 2023-02-16 MED ORDER — LIDOCAINE HCL (CARDIAC) PF 100 MG/5ML IV SOSY
PREFILLED_SYRINGE | INTRAVENOUS | Status: DC | PRN
  Administered 2023-02-16: 100 mg via INTRAVENOUS

## 2023-02-16 MED ORDER — GLYCOPYRROLATE 0.2 MG/ML IJ SOLN
INTRAMUSCULAR | Status: AC
  Filled 2023-02-16: qty 1

## 2023-02-16 SURGICAL SUPPLY — 27 items
BAG DRN RND TRDRP ANRFLXCHMBR (UROLOGICAL SUPPLIES)
BAG URINE DRAIN 2000ML AR STRL (UROLOGICAL SUPPLIES) IMPLANT
CATH FOLEY 2WAY 5CC 16FR (CATHETERS)
CATH URTH 16FR FL 2W BLN LF (CATHETERS) IMPLANT
DEVICE MYOSURE LITE (MISCELLANEOUS) IMPLANT
DEVICE MYOSURE REACH (MISCELLANEOUS) IMPLANT
DRSG TELFA 3X8 NADH STRL (GAUZE/BANDAGES/DRESSINGS) IMPLANT
ELECT REM PT RETURN 9FT ADLT (ELECTROSURGICAL) ×1
ELECTRODE REM PT RTRN 9FT ADLT (ELECTROSURGICAL) ×1 IMPLANT
GLOVE BIO SURGEON STRL SZ7 (GLOVE) ×1 IMPLANT
GLOVE BIOGEL PI IND STRL 7.5 (GLOVE) ×1 IMPLANT
GOWN STRL REUS W/ TWL LRG LVL3 (GOWN DISPOSABLE) ×2 IMPLANT
GOWN STRL REUS W/TWL LRG LVL3 (GOWN DISPOSABLE) ×2
IV NS IRRIG 3000ML ARTHROMATIC (IV SOLUTION) ×1 IMPLANT
KIT PROCEDURE FLUENT (KITS) ×1 IMPLANT
KIT TURNOVER CYSTO (KITS) ×1 IMPLANT
MANIFOLD NEPTUNE II (INSTRUMENTS) ×1 IMPLANT
PACK DNC HYST (MISCELLANEOUS) ×1 IMPLANT
PAD OB MATERNITY 4.3X12.25 (PERSONAL CARE ITEMS) ×1 IMPLANT
PAD PREP OB/GYN DISP 24X41 (PERSONAL CARE ITEMS) ×1 IMPLANT
SCRUB CHG 4% DYNA-HEX 4OZ (MISCELLANEOUS) ×1 IMPLANT
SEAL ROD LENS SCOPE MYOSURE (ABLATOR) ×1 IMPLANT
SET CYSTO W/LG BORE CLAMP LF (SET/KITS/TRAYS/PACK) IMPLANT
SURGILUBE 2OZ TUBE FLIPTOP (MISCELLANEOUS) ×1 IMPLANT
TRAP FLUID SMOKE EVACUATOR (MISCELLANEOUS) ×1 IMPLANT
TUBING CONNECTING 10 (TUBING) ×1 IMPLANT
WATER STERILE IRR 500ML POUR (IV SOLUTION) ×1 IMPLANT

## 2023-02-16 NOTE — Anesthesia Preprocedure Evaluation (Signed)
Anesthesia Evaluation  Patient identified by MRN, date of birth, ID band Patient awake    Reviewed: Allergy & Precautions, NPO status , Patient's Chart, lab work & pertinent test results  History of Anesthesia Complications (+) PONV and history of anesthetic complications  Airway Mallampati: III  TM Distance: <3 FB Neck ROM: full    Dental  (+) Chipped   Pulmonary neg pulmonary ROS, neg shortness of breath   Pulmonary exam normal        Cardiovascular Exercise Tolerance: Good (-) angina (-) Past MI negative cardio ROS Normal cardiovascular exam     Neuro/Psych  Headaches PSYCHIATRIC DISORDERS         GI/Hepatic negative GI ROS, Neg liver ROS,neg GERD  ,,  Endo/Other  negative endocrine ROS    Renal/GU      Musculoskeletal   Abdominal   Peds  Hematology negative hematology ROS (+)   Anesthesia Other Findings Past Medical History: No date: Anxiety No date: Complex endometrial hyperplasia     Comment:  right No date: Endometrial intraepithelial neoplasia (EIN) No date: Headache     Comment:  stress/job related No date: Heavy menstrual bleeding No date: Infertility, female No date: Morbid obesity (HCC) No date: Ovarian cyst No date: PCOS (polycystic ovarian syndrome) No date: PONV (postoperative nausea and vomiting)     Comment:  nausea and vomiting  Past Surgical History: 08/08/2019: CHROMOPERTUBATION; N/A     Comment:  Procedure: CHROMOPERTUBATION;  Surgeon: Ward, Elenora Fender,              MD;  Location: [facility];  Service: Gynecology;                Laterality: N/A; No date: DILATION AND CURETTAGE OF UTERUS     Comment:  2015- cyst in tube -Dr Jean Rosenthal [facility] 10/05/2017: HYSTEROSCOPY WITH D & C; N/A     Comment:  Procedure: DILATATION AND CURETTAGE /HYSTEROSCOPY;                Surgeon: Herold Harms, MD;  Location: [facility];               Service: Gynecology;  Laterality: N/A; 08/08/2019:  HYSTEROSCOPY WITH D & C; N/A     Comment:  Procedure: DILATATION AND CURETTAGE /HYSTEROSCOPY;                Surgeon: Ward, Elenora Fender, MD;  Location: [facility];                Service: Gynecology;  Laterality: N/A; 02/27/2020: HYSTEROSCOPY WITH D & C; N/A     Comment:  Procedure: DILATATION AND CURETTAGE /HYSTEROSCOPY;                Surgeon: Ward, Elenora Fender, MD;  Location: [facility];                Service: Gynecology;  Laterality: N/A; 10/05/2017: LAPAROSCOPIC APPENDECTOMY; N/A     Comment:  Procedure: APPENDECTOMY LAPAROSCOPIC;  Surgeon:               Herold Harms, MD;  Location: [facility];  Service:              Gynecology;  Laterality: N/A; 10/05/2017: LAPAROSCOPIC LYSIS OF ADHESIONS; N/A     Comment:  Procedure: LAPAROSCOPIC LYSIS OF ADHESIONS;  Surgeon:               Herold Harms, MD;  Location: [facility];  Service:  Gynecology;  Laterality: N/A; 10/05/2017: LAPAROSCOPIC OVARIAN CYSTECTOMY; Right     Comment:  Procedure: LAPAROSCOPIC OVARIAN CYSTECTOMY;  Surgeon:               Herold Harms, MD;  Location: [facility];  Service:              Gynecology;  Laterality: Right; 08/08/2019: LAPAROSCOPIC OVARIAN CYSTECTOMY; N/A     Comment:  Procedure: LAPAROSCOPIC OVARIAN CYSTECTOMY;  Surgeon:               Ward, Elenora Fender, MD;  Location: [facility];  Service:               Gynecology;  Laterality: N/A; 10/05/2017: LAPAROSCOPIC SALPINGO OOPHERECTOMY; Right     Comment:  Procedure: LAPAROSCOPIC SALPINGO OOPHORECTOMY;  Surgeon:              Herold Harms, MD;  Location: [facility];  Service:              Gynecology;  Laterality: Right;  BMI    Body Mass Index: 47.30 kg/m      Reproductive/Obstetrics negative OB ROS                             Anesthesia Physical Anesthesia Plan  ASA: 3  Anesthesia Plan: General ETT   Post-op Pain Management:    Induction: Intravenous  PONV Risk Score and Plan: Ondansetron,  Dexamethasone, Midazolam, Treatment may vary due to age or medical condition, Propofol infusion and TIVA  Airway Management Planned: Oral ETT  Additional Equipment:   Intra-op Plan:   Post-operative Plan: Extubation in OR  Informed Consent: I have reviewed the patients History and Physical, chart, labs and discussed the procedure including the risks, benefits and alternatives for the proposed anesthesia with the patient or authorized representative who has indicated his/her understanding and acceptance.     Dental Advisory Given  Plan Discussed with: Anesthesiologist, CRNA and Surgeon  Anesthesia Plan Comments: (Patient consented for risks of anesthesia including but not limited to:  - adverse reactions to medications - damage to eyes, teeth, lips or other oral mucosa - nerve damage due to positioning  - sore throat or hoarseness - Damage to heart, brain, nerves, lungs, other parts of body or loss of life  Patient voiced understanding.)       Anesthesia Quick Evaluation

## 2023-02-16 NOTE — Transfer of Care (Signed)
Immediate Anesthesia Transfer of Care Note  Patient: ROSHAWNA COLCLASURE  Procedure(s) Performed: DILATATION AND CURETTAGE /HYSTEROSCOPY  Patient Location: PACU  Anesthesia Type:General  Level of Consciousness: drowsy  Airway & Oxygen Therapy: Patient Spontanous Breathing and Patient connected to face mask oxygen  Post-op Assessment: Report given to RN and Post -op Vital signs reviewed and stable  Post vital signs: Reviewed and stable  Last Vitals:  Vitals Value Taken Time  BP 128/51   Temp    Pulse 95 02/16/23 0831  Resp 23 02/16/23 0831  SpO2 96 % 02/16/23 0831  Vitals shown include unfiled device data.  Last Pain:  Vitals:   02/16/23 0623  TempSrc: Temporal  PainSc: 0-No pain         Complications: No notable events documented.

## 2023-02-16 NOTE — Anesthesia Procedure Notes (Signed)
Procedure Name: Intubation Date/Time: 02/16/2023 7:37 AM  Performed by: Joanette Gula, Amiri Tritch, CRNAPre-anesthesia Checklist: Patient identified, Emergency Drugs available, Suction available and Patient being monitored Patient Re-evaluated:Patient Re-evaluated prior to induction Oxygen Delivery Method: Circle system utilized Preoxygenation: Pre-oxygenation with 100% oxygen Induction Type: IV induction Ventilation: Mask ventilation without difficulty Laryngoscope Size: McGraph and 3 Grade View: Grade I Tube type: Oral Tube size: 7.0 mm Number of attempts: 1 Airway Equipment and Method: Stylet Placement Confirmation: ETT inserted through vocal cords under direct vision, positive ETCO2 and breath sounds checked- equal and bilateral Secured at: 21 cm Tube secured with: Tape Dental Injury: Teeth and Oropharynx as per pre-operative assessment

## 2023-02-16 NOTE — H&P (Signed)
Preoperative History and Physical  Chief Complaint: Pamela Warren is a 32 y.o. G0P0000 here for surgical management of Endometrial Intraepithelial Neoplasia. No significant preoperative concerns.   History of Present Illness: 32 y.o. 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 She no longer desires child-bearing as she has adopted.   She desires a hysterectomy. She takes Megace as soon as she starts having a heavy period. If she stops taking the medication, she starts bleeding again.   After her last surgery in 2021 she didn't have much in the way of issues until more recently.   Saw Gyn Onc in 2019 and due to her desire for fertility, progesterone options utilized.   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  09/2017: Hysteroscopy, D&C = - ENDOMETRIAL INTRAEPITHELIAL NEOPLASIA (EIN) / ATYPICAL ENDOMETRIAL  HYPERPLASIA WITH EXTENSIVE SQUAMOUS METAPLASIA ARISING IN POLYPOID  FRAGMENTS OF ENDOMETRIUM.  08/2017: Endometrial biopsy: Complex hyperplasia with atypia 08/2013: Hysteroscopy, D&C = - FRAGMENTS CONSISTENT WITH ENDOMETRIAL POLYPS, IN A BACKGROUND  OF DISORDERED PROLIFERATIVE ENDOMETRIUM.  - NO ATYPIA OR MALIGNANCY.   Pap smear ? Unable to obtain due to bleeding  She is having trouble with PCOS and she is gaining weight as her biggest concern. She states that she doesn't eat much and is unsure of why she is gaining weight. She wants to discuss weight loss medication options.   She denies bloating, constipation, sometimes has early satiety.   History of several abdominal surgeries and adhesive disease.   Pelvic ultrasound (11/30/2022) - report: FINDINGS:  Uterus: Measurements: 9.1 x 4.2 x 6.6 cm = volume: 131 mL. No fibroids or  other mass visualized.    Endometrium: Thickness: 16 mm. No focal abnormality visualized.   Right ovary  Measurements: 2.2 by 1.2 x 1.9 cm = volume: 2.5 mL. Normal  appearance/no adnexal mass.   Left ovary  History of oophorectomy   Other findings  No abnormal free fluid.   IMPRESSION:  Endometrial thickness of 16 mm. If bleeding remains unresponsive to  hormonal or medical therapy, sonohysterogram should be considered  for focal lesion work-up. (: Radiological Reasoning: Algorithmic  Workup of Abnormal Vaginal Bleeding with Endovaginal Sonography and  Sonohysterography. AJR.   History of left oophorectomy   Endometrial biopsy 12/04/2022: DISORDERED PROLIFERATIVE PHASE ENDOMETRIUM WITH NUMEROUS QUAMOUS MORULES AND FOCAL SIMPLE HYPERPLASIA WITHOUT ATYPIA.   Proposed surgery: Hysteroscopy, dilation and curettage   Past Medical History:  Diagnosis Date   Anxiety    Complex endometrial hyperplasia    right   Endometrial intraepithelial neoplasia (EIN)    Headache    stress/job related   Heavy menstrual bleeding    Infertility, female    Morbid obesity (HCC)    Ovarian cyst    PCOS (polycystic ovarian syndrome)    PONV (postoperative nausea and vomiting)    nausea and vomiting   Past Surgical History:  Procedure Laterality Date   CHROMOPERTUBATION N/A 08/08/2019   Procedure: CHROMOPERTUBATION;  Surgeon: Ward, Elenora Fender, MD;  Location: [facility];  Service: Gynecology;  Laterality: N/A;   DILATION AND CURETTAGE OF UTERUS     2015- cyst in tube -Dr Jean Rosenthal [facility]   HYSTEROSCOPY WITH D & C N/A 10/05/2017   Procedure: DILATATION AND CURETTAGE /HYSTEROSCOPY;  Surgeon: Herold Harms, MD;  Location: [facility];  Service: Gynecology;  Laterality: N/A;  HYSTEROSCOPY WITH D & C N/A 08/08/2019   Procedure: DILATATION AND CURETTAGE /HYSTEROSCOPY;  Surgeon: Ward, Elenora Fender, MD;  Location: [facility];  Service: Gynecology;  Laterality: N/A;   HYSTEROSCOPY WITH D & C N/A 02/27/2020   Procedure: DILATATION  AND CURETTAGE /HYSTEROSCOPY;  Surgeon: Ward, Elenora Fender, MD;  Location: [facility];  Service: Gynecology;  Laterality: N/A;   LAPAROSCOPIC APPENDECTOMY N/A 10/05/2017   Procedure: APPENDECTOMY LAPAROSCOPIC;  Surgeon: Herold Harms, MD;  Location: [facility];  Service: Gynecology;  Laterality: N/A;   LAPAROSCOPIC LYSIS OF ADHESIONS N/A 10/05/2017   Procedure: LAPAROSCOPIC LYSIS OF ADHESIONS;  Surgeon: Herold Harms, MD;  Location: [facility];  Service: Gynecology;  Laterality: N/A;   LAPAROSCOPIC OVARIAN CYSTECTOMY Right 10/05/2017   Procedure: LAPAROSCOPIC OVARIAN CYSTECTOMY;  Surgeon: Herold Harms, MD;  Location: [facility];  Service: Gynecology;  Laterality: Right;   LAPAROSCOPIC OVARIAN CYSTECTOMY N/A 08/08/2019   Procedure: LAPAROSCOPIC OVARIAN CYSTECTOMY;  Surgeon: Ward, Elenora Fender, MD;  Location: [facility];  Service: Gynecology;  Laterality: N/A;   LAPAROSCOPIC SALPINGO OOPHERECTOMY Right 10/05/2017   Procedure: LAPAROSCOPIC SALPINGO OOPHORECTOMY;  Surgeon: Herold Harms, MD;  Location: [facility];  Service: Gynecology;  Laterality: Right;   OB History  Gravida Para Term Preterm AB Living  0 0 0 0 0 0  SAB IAB Ectopic Multiple Live Births  0 0 0 0    Patient denies any other pertinent gynecologic issues.   No current facility-administered medications on file prior to encounter.   Current Outpatient Medications on File Prior to Encounter  Medication Sig Dispense Refill   aspirin-acetaminophen-caffeine (EXCEDRIN MIGRAINE) 250-250-65 MG tablet Take 2 tablets by mouth every 6 (six) hours as needed for headache.     megestrol (MEGACE) 40 MG tablet Take 80 mg by mouth 2 (two) times daily.     cetirizine-pseudoephedrine (ZYRTEC-D) 5-120 MG tablet Take 1 tablet by mouth daily as needed for allergies.     escitalopram (LEXAPRO) 20 MG tablet Take 1 tablet (20 mg total) by mouth daily. (Patient taking differently: Take 20 mg by mouth every morning.) 90 tablet 1   fluticasone  (FLONASE) 50 MCG/ACT nasal spray Place 1 spray into both nostrils daily as needed for allergies or rhinitis.     Multiple Vitamin (MULTIVITAMIN WITH MINERALS) TABS tablet Take 1 tablet by mouth daily.     Allergies  Allergen Reactions   Other Anaphylaxis and Hives    Tree Nuts    Social History:   reports that she has never smoked. She has never used smokeless tobacco. She reports that she does not currently use alcohol. She reports that she does not use drugs.  Family History  Problem Relation Age of Onset   Breast cancer Maternal Grandmother    Diabetes Maternal Grandmother    Lung cancer Paternal Grandmother    Lupus Paternal Grandmother    Diabetes Mother    Thyroid cancer Mother    Colon cancer Other    Cervical cancer Other    Bipolar disorder Brother    Alcohol abuse Brother    Heart disease Neg Hx     Review of Systems: Noncontributory  PHYSICAL EXAM: Blood pressure 119/85, pulse (!) 102, temperature 97.8 F (36.6 C), temperature source Temporal, resp. rate 16, height 5\' 4"  (1.626 m), weight 125 kg, last menstrual period 02/07/2023, SpO2 95%. CONSTITUTIONAL: Well-developed, well-nourished female in no acute distress.  HENT:  Normocephalic, atraumatic, External right and left ear normal. Oropharynx is clear and moist EYES:  Conjunctivae and EOM are normal. Pupils are equal, round, and reactive to light. No scleral icterus.  NECK: Normal range of motion, supple, no masses SKIN: Skin is warm and dry. No rash noted. Not diaphoretic. No erythema. No pallor. NEUROLGIC: Alert and oriented to person, place, and time. Normal reflexes, muscle tone coordination. No cranial nerve deficit noted. PSYCHIATRIC: Normal mood and affect. Normal behavior. Normal judgment and thought content. CARDIOVASCULAR: Normal heart rate noted, regular rhythm RESPIRATORY: Effort and breath sounds normal, no problems with respiration noted ABDOMEN: Soft, nontender, nondistended. PELVIC:  Deferred MUSCULOSKELETAL: Normal range of motion. No edema and no tenderness. 2+ distal pulses.  Labs: Results for orders placed or performed during the hospital encounter of 02/16/23 (from the past 336 hour(s))  Basic metabolic panel   Collection Time: 02/16/23  6:35 AM  Result Value Ref Range   Sodium 137 135 - 145 mmol/L   Potassium 3.9 3.5 - 5.1 mmol/L   Chloride 107 98 - 111 mmol/L   CO2 21 (L) 22 - 32 mmol/L   Glucose, Bld 98 70 - 99 mg/dL   BUN 11 6 - 20 mg/dL   Creatinine, Ser 1.61 0.44 - 1.00 mg/dL   Calcium 9.2 8.9 - 09.6 mg/dL   GFR, Estimated >04 >54 mL/min   Anion gap 9 5 - 15  Pregnancy, urine POC   Collection Time: 02/16/23  6:35 AM  Result Value Ref Range   Preg Test, Ur NEGATIVE NEGATIVE    Imaging Studies: No results found.  Assessment: Endometrial Intraepithelial Neoplasia  Plan: Patient will undergo surgical management with the above-noted surgery.   The risks of surgery were discussed in detail with the patient including but not limited to: bleeding which may require transfusion or reoperation; infection which may require antibiotics; injury to surrounding organs which may involve bowel, bladder, ureters ; need for additional procedures including laparoscopy or laparotomy; thromboembolic phenomenon, surgical site problems and other postoperative/anesthesia complications. Likelihood of success in alleviating the patient's condition was discussed. Routine postoperative instructions will be reviewed with the patient and her family in detail after surgery.  The patient concurred with the proposed plan, giving informed written consent for the surgery.  Patient has been NPO since last night she will remain NPO for procedure.  Anesthesia and OR aware.  Preoperative prophylactic antibiotics, as indicated, and SCDs ordered on call to the OR.  To OR when ready.  Thomasene Mohair, MD 02/16/2023 7:17 AM

## 2023-02-16 NOTE — Discharge Instructions (Signed)

## 2023-02-16 NOTE — Op Note (Signed)
Operative Note   Name: Pamela Warren  Date of Service: 02/16/2023  DOB: 1990-10-04  MRN: [id number]   PRE-OP DIAGNOSIS: Endometrial Intraepithelial Neoplasia   POST-OP DIAGNOSIS: Endometrial Intraepithelial Neoplasia   SURGEON: Surgeons and Role:    Conard Novak, MD - Primary  PROCEDURE: Procedure(s): Hysteroscopy, dilation and curettage, removal of abnormal tissue  ANESTHESIA: Choice   ESTIMATED BLOOD LOSS: 25 mL  DRAINS: none   TOTAL IV FLUIDS: 600 mL  SPECIMENS:  1) Endometrial lesions 2) Endometrial curettings  VTE PROPHYLAXIS: SCDs to the bilateral lower extremities  ANTIBIOTICS: none indicated  FLUID DEFICIT: 135 mL  COMPLICATIONS: none  DISPOSITION: PACU - hemodynamically stable.  CONDITION: stable  INDICATION: 32 y.o. G0P0000 female with a history of EIN who has deferred treatment for several years in order to conceive.  She has continued to have bleeding requiring megestrol.  She was taken to the OR today for therapeutic reasons and diagnostic reasons, given her history.    FINDINGS: Exam under anesthesia revealed small, mobile anteverted uterus with no masses and bilateral adnexa without masses or fullness. Hysteroscopy revealed a uterine cavity with multiple lesions noted that appeared polypoid, but connected from one portion of the endometrial to another (like a bridge of tissue). The shape was irregular and the tissue was vascular.  The bilateral tubal ostia appeared normal, as well as the endocervical canal.  PROCEDURE IN DETAIL:  After informed consent was obtained, the patient was taken to the operating room where anesthesia was obtained without difficulty. The patient was positioned in the dorsal lithotomy position in [place] stirrups.  The patient's bladder was catheterized with an in-and-out foley catheter.  The patient was examined under anesthesia, with the above noted findings.  The bi-valved speculum was placed inside the patient's vagina, and  the the anterior lip of the cervix was grasped with the tenaculum.  Then the cervix was progressively dilated to a 7 mm Hegar dilator.  The hysteroscope was introduced, with the above noted findings. The MyoSure Reach device was inserted and the above, abnormal tissue was resected.  The hysteroscope was removed and the uterine cavity was curetted until a gritty texture was noted, yielding moderate endometrial curettings.   The hysteroscope was re-introduced to verify ongoing hemostasis and that no damage to the uterus was sustained with curettage. The pressure in the uterus was lowered to a pressure of that well-below the MAP and hemostasis was directly observed.  The hysteroscope was removed.  The tenaculum was removed.  Hemostasis was noted at the tenaculum entry sites after the application of silver nitrate.  No bleeding was noted through the external cervical os.  After verifying the vagina was empty of sponges and instruments, the sterile speculum was removed.   She was then taken out of dorsal lithotomy.  The patient tolerated the procedure well.  Sponge, lap and needle counts were correct x2.  The patient was taken to recovery room in excellent condition.  Conard Novak, MD, [facility] 02/16/2023 8:23 AM

## 2023-02-17 NOTE — Anesthesia Postprocedure Evaluation (Addendum)
Anesthesia Post Note  Patient: COREE RIESTER  Procedure(s) Performed: DILATATION AND CURETTAGE /HYSTEROSCOPY, REMOVIAL OF ABDNORMAL TISSUE  Patient location during evaluation: PACU Anesthesia Type: General Level of consciousness: awake and alert Pain management: pain level controlled Vital Signs Assessment: post-procedure vital signs reviewed and stable Respiratory status: spontaneous breathing, nonlabored ventilation, respiratory function stable and patient connected to nasal cannula oxygen Cardiovascular status: blood pressure returned to baseline and stable Postop Assessment: no apparent nausea or vomiting Anesthetic complications: no   No notable events documented.   Last Vitals:  Vitals:   02/16/23 0915 02/16/23 0935  BP: (!) 146/70 137/86  Pulse: 90 72  Resp: 17 15  Temp: (!) 36.3 C 36.4 C  SpO2: 99% 100%    Last Pain:  Vitals:   02/16/23 0935  TempSrc: Oral  PainSc: 2                  Cleda Mccreedy Rickiya Picariello

## 2023-02-19 LAB — SURGICAL PATHOLOGY

## 2023-05-14 ENCOUNTER — Encounter
Admission: RE | Admit: 2023-05-14 | Discharge: 2023-05-14 | Disposition: A | Discharge status: Home / Self Care | Payer: BC Managed Care – PPO | Source: Ambulatory Visit | Attending: Obstetrics and Gynecology | Admitting: Obstetrics and Gynecology

## 2023-05-14 ENCOUNTER — Other Ambulatory Visit: Payer: Self-pay

## 2023-05-14 VITALS — Ht 64.0 in | Wt 240.0 lb

## 2023-05-14 DIAGNOSIS — N979 Female infertility, unspecified: Secondary | ICD-10-CM

## 2023-05-14 DIAGNOSIS — Z01812 Encounter for preprocedural laboratory examination: Secondary | ICD-10-CM

## 2023-05-14 DIAGNOSIS — E282 Polycystic ovarian syndrome: Secondary | ICD-10-CM

## 2023-05-14 DIAGNOSIS — N8502 Endometrial intraepithelial neoplasia [EIN]: Secondary | ICD-10-CM

## 2023-05-14 HISTORY — DX: Female infertility associated with anovulation: N97.0

## 2023-05-14 HISTORY — DX: Excessive and frequent menstruation with irregular cycle: N92.1

## 2023-05-14 NOTE — Patient Instructions (Addendum)
Your procedure is scheduled on:  Tuesday December 31  Report to the Registration Desk on the 1st floor of the [facility] To find out your arrival time, please call [phone] between 1PM - 3PM on:  Monday December 30  If your arrival time is 6:00 am, do not arrive before that time as the [facility] entrance doors do not open until 6:00 am.  REMEMBER: Instructions that are not followed completely may result in serious medical risk, up to and including death; or upon the discretion of your surgeon and anesthesiologist your surgery may need to be rescheduled.  Do not eat food after midnight the night before surgery.  No gum chewing or hard candies.   One week prior to surgery:Starting Tuesday December 24 Stop Anti-inflammatories (NSAIDS) such as Advil, Aleve, Ibuprofen, Motrin, Naproxen, Naprosyn and Aspirin based products such as Excedrin, Goody's Powder, BC Powder. Stop ANY OVER THE COUNTER supplements until after surgery. cetirizine-pseudoephedrine (ZYRTEC-D)   You may however, continue to take Tylenol if needed for pain up until the day of surgery.   Continue taking all of your other prescription medications up until the day of surgery.  ON THE DAY OF SURGERY ONLY TAKE THESE MEDICATIONS WITH SIPS OF WATER:  escitalopram (LEXAPRO)  fluticasone (FLONASE)  megestrol (MEGACE)  Multiple Vitamin (MULTIVITAMIN WITH MINERALS)   No Alcohol for 24 hours before or after surgery.  No Smoking including e-cigarettes for 24 hours before surgery.  No chewable tobacco products for at least 6 hours before surgery.  No nicotine patches on the day of surgery.  Do not use any "recreational" drugs for at least a week (preferably 2 weeks) before your surgery.  Please be advised that the combination of cocaine and anesthesia may have negative outcomes, up to and including death. If you test positive for cocaine, your surgery will be cancelled.  On the morning of surgery brush your teeth with  toothpaste and water, you may rinse your mouth with mouthwash if you wish. Do not swallow any toothpaste or mouthwash.  Use CHG wipes as directed on instruction sheet.  Do not wear jewelry, make-up, hairpins, clips or nail polish.  For welded (permanent) jewelry: bracelets, anklets, waist bands, etc.  Please have this removed prior to surgery.  If it is not removed, there is a chance that hospital personnel will need to cut it off on the day of surgery.  Do not wear lotions, powders, or perfumes.   Do not shave body hair from the neck down 48 hours before surgery.  Contact lenses, hearing aids and dentures may not be worn into surgery.  Do not bring valuables to the hospital. [facility] is not responsible for any missing/lost belongings or valuables.   Notify your doctor if there is any change in your medical condition (cold, fever, infection).  Wear comfortable clothing (specific to your surgery type) to the hospital.  After surgery, you can help prevent lung complications by doing breathing exercises.  Take deep breaths and cough every 1-2 hours.  When coughing or sneezing, hold a pillow firmly against your incision with both hands. This is called "splinting." Doing this helps protect your incision. It also decreases belly discomfort.   If you are being discharged the day of surgery, you will not be allowed to drive home. You will need a responsible individual to drive you home and stay with you for 24 hours after surgery.   If you are taking public transportation, you will need to have a  responsible individual with you.  Please call the Pre-admissions Testing Dept. at [phone] if you have any questions about these instructions.  Surgery Visitation Policy:  Patients having surgery or a procedure may have two visitors.  Children under the age of 11 must have an adult with them who is not the patient.          Preparing for Surgery with CHLORHEXIDINE GLUCONATE  (CHG) Soap  Chlorhexidine Gluconate (CHG) Soap  o An antiseptic cleaner that kills germs and bonds with the skin to continue killing germs even after washing  o Used for showering the night before surgery and morning of surgery  Before surgery, you can play an important role by reducing the number of germs on your skin.  CHG (Chlorhexidine gluconate) soap is an antiseptic cleanser which kills germs and bonds with the skin to continue killing germs even after washing.  Please do not use if you have an allergy to CHG or antibacterial soaps. If your skin becomes reddened/irritated stop using the CHG.  1. Shower the NIGHT BEFORE SURGERY and the MORNING OF SURGERY with CHG soap.  2. If you choose to wash your hair, wash your hair first as usual with your normal shampoo.  3. After shampooing, rinse your hair and body thoroughly to remove the shampoo.  4. Use CHG as you would any other liquid soap. You can apply CHG directly to the skin and wash gently with a scrungie or a clean washcloth.  5. Apply the CHG soap to your body only from the neck down. Do not use on open wounds or open sores. Avoid contact with your eyes, ears, mouth, and genitals (private parts). Wash face and genitals (private parts) with your normal soap.  6. Wash thoroughly, paying special attention to the area where your surgery will be performed.  7. Thoroughly rinse your body with warm water.  8. Do not shower/wash with your normal soap after using and rinsing off the CHG soap.  9. Pat yourself dry with a clean towel.  10. Wear clean pajamas to bed the night before surgery.  12. Place clean sheets on your bed the night of your first shower and do not sleep with pets.  13. Shower again with the CHG soap on the day of surgery prior to arriving at the hospital.  14. Do not apply any deodorants/lotions/powders.  15. Please wear clean clothes to the hospital.

## 2023-05-18 ENCOUNTER — Encounter
Admission: RE | Admit: 2023-05-18 | Discharge: 2023-05-18 | Disposition: A | Discharge status: Home / Self Care | Payer: BC Managed Care – PPO | Source: Ambulatory Visit | Attending: Obstetrics and Gynecology | Admitting: Obstetrics and Gynecology

## 2023-05-18 DIAGNOSIS — N921 Excessive and frequent menstruation with irregular cycle: Secondary | ICD-10-CM | POA: Diagnosis not present

## 2023-05-18 DIAGNOSIS — Z01812 Encounter for preprocedural laboratory examination: Secondary | ICD-10-CM | POA: Insufficient documentation

## 2023-05-18 DIAGNOSIS — N8502 Endometrial intraepithelial neoplasia [EIN]: Secondary | ICD-10-CM | POA: Insufficient documentation

## 2023-05-18 LAB — CBC
HCT: 36.6 % (ref 36.0–46.0)
Hemoglobin: 12.4 g/dL (ref 12.0–15.0)
MCH: 28.2 pg (ref 26.0–34.0)
MCHC: 33.9 g/dL (ref 30.0–36.0)
MCV: 83.4 fL (ref 80.0–100.0)
Platelets: 299 10*3/uL (ref 150–400)
RBC: 4.39 MIL/uL (ref 3.87–5.11)
RDW: 13.8 % (ref 11.5–15.5)
WBC: 7 10*3/uL (ref 4.0–10.5)
nRBC: 0 % (ref 0.0–0.2)

## 2023-05-18 LAB — TYPE AND SCREEN
ABO/RH(D): O POS
Antibody Screen: NEGATIVE

## 2023-05-21 MED ORDER — SOD CITRATE-CITRIC ACID 500-334 MG/5ML PO SOLN
30.0000 mL | ORAL | Status: DC
  Filled 2023-05-21: qty 30

## 2023-05-21 MED ORDER — POVIDONE-IODINE 10 % EX SWAB: 2.0000 | Freq: Once | CUTANEOUS | Status: DC

## 2023-05-21 MED ORDER — SODIUM CHLORIDE 0.9 % IV SOLN
3.0000 g | INTRAVENOUS | Status: AC
  Administered 2023-05-22: 3 g via INTRAVENOUS
  Filled 2023-05-21: qty 3

## 2023-05-21 MED ORDER — ORAL CARE MOUTH RINSE: 15.0000 mL | Freq: Once | OROMUCOSAL | Status: AC

## 2023-05-21 MED ORDER — SODIUM CHLORIDE 0.9 % IV SOLN: INTRAVENOUS | Status: AC

## 2023-05-21 MED ORDER — CHLORHEXIDINE GLUCONATE 0.12 % MT SOLN
15.0000 mL | Freq: Once | OROMUCOSAL | Status: AC
  Administered 2023-05-22: 15 mL via OROMUCOSAL

## 2023-05-22 ENCOUNTER — Other Ambulatory Visit: Payer: Self-pay

## 2023-05-22 ENCOUNTER — Ambulatory Visit
Admission: RE | Admit: 2023-05-22 | Discharge: 2023-05-22 | Disposition: A | Discharge status: Home / Self Care | Payer: BC Managed Care – PPO | Attending: Obstetrics and Gynecology | Admitting: Obstetrics and Gynecology

## 2023-05-22 ENCOUNTER — Ambulatory Visit: Payer: Self-pay | Admitting: Urgent Care

## 2023-05-22 ENCOUNTER — Encounter: Payer: Self-pay | Admitting: Obstetrics and Gynecology

## 2023-05-22 ENCOUNTER — Encounter
Admission: RE | Disposition: A | Discharge status: Home / Self Care | Payer: Self-pay | Source: Home / Self Care | Attending: Obstetrics and Gynecology

## 2023-05-22 ENCOUNTER — Ambulatory Visit: Payer: BC Managed Care – PPO | Admitting: Certified Registered Nurse Anesthetist

## 2023-05-22 DIAGNOSIS — E282 Polycystic ovarian syndrome: Secondary | ICD-10-CM | POA: Diagnosis not present

## 2023-05-22 DIAGNOSIS — Z6841 Body Mass Index (BMI) 40.0 and over, adult: Secondary | ICD-10-CM | POA: Insufficient documentation

## 2023-05-22 DIAGNOSIS — N921 Excessive and frequent menstruation with irregular cycle: Secondary | ICD-10-CM | POA: Diagnosis present

## 2023-05-22 DIAGNOSIS — Z01812 Encounter for preprocedural laboratory examination: Secondary | ICD-10-CM

## 2023-05-22 DIAGNOSIS — N979 Female infertility, unspecified: Secondary | ICD-10-CM

## 2023-05-22 DIAGNOSIS — N8502 Endometrial intraepithelial neoplasia [EIN]: Secondary | ICD-10-CM | POA: Diagnosis not present

## 2023-05-22 HISTORY — PX: ROBOTIC ASSISTED LAPAROSCOPIC HYSTERECTOMY AND SALPINGECTOMY: SHX6379

## 2023-05-22 HISTORY — PX: ROBOTIC ASSISTED LAPAROSCOPIC LYSIS OF ADHESION: SHX6080

## 2023-05-22 HISTORY — PX: CYSTOSCOPY: SHX5120

## 2023-05-22 LAB — POCT PREGNANCY, URINE: Preg Test, Ur: NEGATIVE

## 2023-05-22 SURGERY — XI ROBOTIC ASSISTED LAPAROSCOPIC HYSTERECTOMY AND SALPINGECTOMY
Anesthesia: General

## 2023-05-22 MED ORDER — GLYCOPYRROLATE 0.2 MG/ML IJ SOLN
INTRAMUSCULAR | Status: DC | PRN
  Administered 2023-05-22: .2 mg via INTRAVENOUS

## 2023-05-22 MED ORDER — CHLORHEXIDINE GLUCONATE 0.12 % MT SOLN
OROMUCOSAL | Status: AC
Start: 2023-05-22 — End: ?
  Filled 2023-05-22: qty 15

## 2023-05-22 MED ORDER — FENTANYL CITRATE (PF) 100 MCG/2ML IJ SOLN: 25.0000 ug | INTRAMUSCULAR | Status: DC | PRN

## 2023-05-22 MED ORDER — OXYCODONE-ACETAMINOPHEN 5-325 MG PO TABS: 1.0000 | ORAL_TABLET | Freq: Four times a day (QID) | ORAL | 0 refills | Status: AC | PRN

## 2023-05-22 MED ORDER — ONDANSETRON HCL 4 MG/2ML IJ SOLN
INTRAMUSCULAR | Status: DC | PRN
  Administered 2023-05-22: 4 mg via INTRAVENOUS

## 2023-05-22 MED ORDER — SUGAMMADEX SODIUM 200 MG/2ML IV SOLN
INTRAVENOUS | Status: DC | PRN
  Administered 2023-05-22: 200 mg via INTRAVENOUS

## 2023-05-22 MED ORDER — FENTANYL CITRATE (PF) 100 MCG/2ML IJ SOLN
INTRAMUSCULAR | Status: DC | PRN
  Administered 2023-05-22 (×2): 50 ug via INTRAVENOUS

## 2023-05-22 MED ORDER — PROPOFOL 10 MG/ML IV BOLUS
INTRAVENOUS | Status: AC
  Filled 2023-05-22: qty 20

## 2023-05-22 MED ORDER — ROCURONIUM BROMIDE 100 MG/10ML IV SOLN
INTRAVENOUS | Status: DC | PRN
  Administered 2023-05-22 (×2): 20 mg via INTRAVENOUS
  Administered 2023-05-22: 60 mg via INTRAVENOUS
  Administered 2023-05-22: 20 mg via INTRAVENOUS
  Administered 2023-05-22: 30 mg via INTRAVENOUS
  Administered 2023-05-22: 20 mg via INTRAVENOUS

## 2023-05-22 MED ORDER — MIDAZOLAM HCL 2 MG/2ML IJ SOLN
INTRAMUSCULAR | Status: DC | PRN
  Administered 2023-05-22: 2 mg via INTRAVENOUS

## 2023-05-22 MED ORDER — SODIUM CHLORIDE 0.9 % IV SOLN: INTRAVENOUS | Status: DC | PRN

## 2023-05-22 MED ORDER — PROPOFOL 1000 MG/100ML IV EMUL
INTRAVENOUS | Status: AC
  Filled 2023-05-22: qty 100

## 2023-05-22 MED ORDER — DEXMEDETOMIDINE HCL IN NACL 80 MCG/20ML IV SOLN
INTRAVENOUS | Status: DC | PRN
  Administered 2023-05-22 (×4): 4 ug via INTRAVENOUS

## 2023-05-22 MED ORDER — IBUPROFEN 600 MG PO TABS: 600.0000 mg | ORAL_TABLET | Freq: Four times a day (QID) | ORAL | 0 refills | Status: AC

## 2023-05-22 MED ORDER — ACETAMINOPHEN 10 MG/ML IV SOLN
INTRAVENOUS | Status: AC
  Filled 2023-05-22: qty 100

## 2023-05-22 MED ORDER — ONDANSETRON 4 MG PO TBDP: 4.0000 mg | ORAL_TABLET | Freq: Four times a day (QID) | ORAL | 0 refills | Status: AC | PRN

## 2023-05-22 MED ORDER — HEMOSTATIC AGENTS (NO CHARGE) OPTIME
TOPICAL | Status: DC | PRN
  Administered 2023-05-22: 1 via TOPICAL

## 2023-05-22 MED ORDER — ACETAMINOPHEN 10 MG/ML IV SOLN
INTRAVENOUS | Status: DC | PRN
  Administered 2023-05-22: 1000 mg via INTRAVENOUS

## 2023-05-22 MED ORDER — FENTANYL CITRATE (PF) 100 MCG/2ML IJ SOLN
INTRAMUSCULAR | Status: AC
  Filled 2023-05-22: qty 2

## 2023-05-22 MED ORDER — BUPIVACAINE HCL 0.5 % IJ SOLN
INTRAMUSCULAR | Status: DC | PRN
  Administered 2023-05-22: 10 mL

## 2023-05-22 MED ORDER — DEXAMETHASONE SODIUM PHOSPHATE 10 MG/ML IJ SOLN
INTRAMUSCULAR | Status: DC | PRN
  Administered 2023-05-22: 10 mg via INTRAVENOUS

## 2023-05-22 MED ORDER — DROPERIDOL 2.5 MG/ML IJ SOLN: 0.6250 mg | Freq: Once | INTRAMUSCULAR | Status: DC | PRN

## 2023-05-22 MED ORDER — MIDAZOLAM HCL 2 MG/2ML IJ SOLN
INTRAMUSCULAR | Status: AC
Start: 2023-05-22 — End: ?
  Filled 2023-05-22: qty 2

## 2023-05-22 MED ORDER — BUPIVACAINE HCL (PF) 0.5 % IJ SOLN
INTRAMUSCULAR | Status: AC
  Filled 2023-05-22: qty 30

## 2023-05-22 MED ORDER — PROPOFOL 10 MG/ML IV BOLUS
INTRAVENOUS | Status: DC | PRN
  Administered 2023-05-22: 150 ug/kg/min via INTRAVENOUS
  Administered 2023-05-22: 150 mg via INTRAVENOUS

## 2023-05-22 MED ORDER — LIDOCAINE HCL (CARDIAC) PF 100 MG/5ML IV SOSY
PREFILLED_SYRINGE | INTRAVENOUS | Status: DC | PRN
  Administered 2023-05-22: 100 mg via INTRAVENOUS

## 2023-05-22 SURGICAL SUPPLY — 63 items
APPLICATOR ARISTA FLEXITIP XL (MISCELLANEOUS) IMPLANT
BAG URINE DRAIN 2000ML AR STRL (UROLOGICAL SUPPLIES) ×3 IMPLANT
BLADE SURG 15 STRL LF DISP TIS (BLADE) ×3 IMPLANT
BLADE SURG SZ11 CARB STEEL (BLADE) ×3 IMPLANT
CANNULA CAP OBTURATR AIRSEAL 8 (CAP) ×3 IMPLANT
CATH URTH 16FR FL 2W BLN LF (CATHETERS) ×3 IMPLANT
COVER TIP SHEARS 8 DVNC (MISCELLANEOUS) ×3 IMPLANT
DERMABOND ADVANCED .7 DNX12 (GAUZE/BANDAGES/DRESSINGS) ×3 IMPLANT
DRAPE ARM DVNC X/XI (DISPOSABLE) ×12 IMPLANT
DRAPE COLUMN DVNC XI (DISPOSABLE) ×3 IMPLANT
DRAPE UNDER BUTTOCK W/FLU (DRAPES) ×3 IMPLANT
DRIVER NDL MEGA SUTCUT DVNCXI (INSTRUMENTS) ×3 IMPLANT
DRIVER NDLE MEGA SUTCUT DVNCXI (INSTRUMENTS) ×3 IMPLANT
ELECT REM PT RETURN 9FT ADLT (ELECTROSURGICAL) ×3
ELECTRODE REM PT RTRN 9FT ADLT (ELECTROSURGICAL) ×3 IMPLANT
FORCEPS BPLR FENES DVNC XI (FORCEP) ×3 IMPLANT
FORCEPS BPLR R/ABLATION 8 DVNC (INSTRUMENTS) ×3 IMPLANT
GAUZE 4X4 16PLY ~~LOC~~+RFID DBL (SPONGE) ×3 IMPLANT
GLOVE BIO SURGEON STRL SZ7 (GLOVE) ×9 IMPLANT
GLOVE BIOGEL PI IND STRL 7.5 (GLOVE) ×9 IMPLANT
GOWN STRL REUS W/ TWL LRG LVL3 (GOWN DISPOSABLE) ×9 IMPLANT
GOWN STRL REUS W/ TWL XL LVL3 (GOWN DISPOSABLE) ×3 IMPLANT
HEMOSTAT ARISTA ABSORB 3G PWDR (HEMOSTASIS) IMPLANT
IRRIGATION STRYKERFLOW (MISCELLANEOUS) IMPLANT
IRRIGATOR STRYKERFLOW (MISCELLANEOUS)
IRRIGATOR SUCT 8 DISP DVNC XI (IRRIGATION / IRRIGATOR) IMPLANT
IV LACTATED RINGERS 1000ML (IV SOLUTION) ×3 IMPLANT
IV NS 1000ML BAXH (IV SOLUTION) ×3 IMPLANT
KIT PINK PAD W/HEAD ARE REST (MISCELLANEOUS) ×3 IMPLANT
KIT PINK PAD W/HEAD ARM REST (MISCELLANEOUS) ×3 IMPLANT
KIT TURNOVER CYSTO (KITS) ×3 IMPLANT
LABEL OR SOLS (LABEL) ×3 IMPLANT
MANIFOLD NEPTUNE II (INSTRUMENTS) ×3 IMPLANT
MANIPULATOR VCARE LG CRV RETR (MISCELLANEOUS) IMPLANT
MANIPULATOR VCARE STD CRV RETR (MISCELLANEOUS) IMPLANT
NDL HYPO 22X1.5 SAFETY MO (MISCELLANEOUS) ×3 IMPLANT
NEEDLE HYPO 22X1.5 SAFETY MO (MISCELLANEOUS) ×3 IMPLANT
NS IRRIG 500ML POUR BTL (IV SOLUTION) ×3 IMPLANT
OBTURATOR OPTICAL STND 8 DVNC (TROCAR) ×3
OBTURATOR OPTICALSTD 8 DVNC (TROCAR) ×3 IMPLANT
OCCLUDER COLPOPNEUMO (BALLOONS) ×3 IMPLANT
PACK GYN LAPAROSCOPIC (MISCELLANEOUS) ×3 IMPLANT
PAD PREP OB/GYN DISP 24X41 (PERSONAL CARE ITEMS) ×3 IMPLANT
SCISSORS MNPLR CVD DVNC XI (INSTRUMENTS) ×3 IMPLANT
SCRUB CHG 4% DYNA-HEX 4OZ (MISCELLANEOUS) ×3 IMPLANT
SEAL UNIV 5-12 XI (MISCELLANEOUS) ×9 IMPLANT
SEALER VESSEL EXT DVNC XI (MISCELLANEOUS) IMPLANT
SET CYSTO W/LG BORE CLAMP LF (SET/KITS/TRAYS/PACK) ×3 IMPLANT
SET TUBE FILTERED XL AIRSEAL (SET/KITS/TRAYS/PACK) ×3 IMPLANT
SOL ELECTROSURG ANTI STICK (MISCELLANEOUS) ×3
SOL PREP PVP 2OZ (MISCELLANEOUS) ×3
SOLUTION ELECTROSURG ANTI STCK (MISCELLANEOUS) ×3 IMPLANT
SOLUTION PREP PVP 2OZ (MISCELLANEOUS) ×3 IMPLANT
SURGILUBE 2OZ TUBE FLIPTOP (MISCELLANEOUS) ×3 IMPLANT
SUT MNCRL 4-0 27XMFL (SUTURE) ×3
SUT STRATA PDS 0 30 CT-2.5 (SUTURE) ×3 IMPLANT
SUT VIC AB 0 CT2 27 (SUTURE) ×3 IMPLANT
SUT VIC AB 4-0 FS2 27 (SUTURE) IMPLANT
SUTURE MNCRL 4-0 27XMF (SUTURE) ×3 IMPLANT
SYR 10ML LL (SYRINGE) ×3 IMPLANT
SYR 50ML LL SCALE MARK (SYRINGE) ×3 IMPLANT
TRAP FLUID SMOKE EVACUATOR (MISCELLANEOUS) ×3 IMPLANT
WATER STERILE IRR 500ML POUR (IV SOLUTION) ×3 IMPLANT

## 2023-05-22 NOTE — H&P (Signed)
 Preoperative History and Physical  Pamela Warren is a 32 y.o. G0P0000 here for surgical management of Endometrial Intraepithelial Neoplasia and menorrhagia with irregular cycle.   No significant preoperative concerns.  History of Present Illness: 32 y.o. 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 She no longer desires child-bearing as she has adopted.   She desires a hysterectomy. She takes Megace  as soon as she starts having a heavy period. If she stops taking the medication, she starts bleeding again.   After her last surgery in 2021 she didn't have much in the way of issues until more recently.   Saw Gyn Onc in 2019 and due to her desire for fertility, progesterone  options utilized.   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  09/2017: Hysteroscopy, D&C = - ENDOMETRIAL INTRAEPITHELIAL NEOPLASIA (EIN) / ATYPICAL ENDOMETRIAL  HYPERPLASIA WITH EXTENSIVE SQUAMOUS METAPLASIA ARISING IN POLYPOID  FRAGMENTS OF ENDOMETRIUM.  08/2017: Endometrial biopsy: Complex hyperplasia with atypia 08/2013: Hysteroscopy, D&C = - FRAGMENTS CONSISTENT WITH ENDOMETRIAL POLYPS, IN A BACKGROUND  OF DISORDERED PROLIFERATIVE ENDOMETRIUM.  - NO ATYPIA OR MALIGNANCY.   Pap smear ? Unable to obtain due to bleeding  She is having trouble with PCOS and she is gaining weight as her biggest concern. She states that she doesn't eat much and is unsure of why she is gaining weight. She wants to discuss weight loss medication options.   She denies bloating, constipation, sometimes has early satiety.   History of several abdominal surgeries and adhesive disease.   Pelvic ultrasound (11/30/2022) - report: FINDINGS:  Uterus: Measurements: 9.1 x 4.2 x 6.6 cm = volume: 131 mL. No fibroids or  other mass  visualized.   Endometrium: Thickness: 16 mm. No focal abnormality visualized.   Right ovary  Measurements: 2.2 by 1.2 x 1.9 cm = volume: 2.5 mL. Normal  appearance/no adnexal mass.   Left ovary  History of oophorectomy   Other findings  No abnormal free fluid.   IMPRESSION:  Endometrial thickness of 16 mm. If bleeding remains unresponsive to  hormonal or medical therapy, sonohysterogram should be considered  for focal lesion work-up. (: Radiological Reasoning: Algorithmic  Workup of Abnormal Vaginal Bleeding with Endovaginal Sonography and  Sonohysterography. AJR.   History of left oophorectomy   Endometrial biopsy 12/04/2022: DISORDERED PROLIFERATIVE PHASE ENDOMETRIUM WITH NUMEROUS QUAMOUS MORULES AND FOCAL SIMPLE HYPERPLASIA WITHOUT ATYPIA.   She underwent a hysteroscopy, D&C on 02/16/2023 with the following pathology:  FINAL DIAGNOSIS        1. Endometrium, curettage,  :       -  DISORDERED PROLIFERATIVE PHASE ENDOMETRIUM WITH SQUAMOUS METAPLASIA AND       PROMINENT VESSELS CONSISTENT WITH POLYP.       -  STROMAL DECIDUALIZATION CONSISTENT WITH STEROID EFFECT.       -  NEGATIVE FOR ATYPIA/HYPERPLASIA.        2. Endometrium, biopsy, lesions :       -  POLYPOID DISORDERED PROLIFERATIVE ENDOMETRIUM WITH STROMAL DECIDUALIZATION       AND SQUAMOUS METAPLASIA AND PROMINENT VASCULARITY CONSISTENT WITH A POLYP.       -  NEGATIVE FOR DYSPLASIA   Proposed surgery: Robot assisted Total laparoscopic hysterectomy, bilateral salpingectomy, cystoscopy. (This may only be a left salpingectomy, as she reportedly has had a right salpingo-oophorectomy)  Past Medical History:  Diagnosis Date   Anxiety    Complex endometrial hyperplasia    right   Endometrial intraepithelial neoplasia (EIN)    Headache    stress/job related   Heavy menstrual bleeding    Infertility, female    Menorrhagia with irregular cycle    Morbid obesity (HCC)    Ovarian cyst    PCOS (polycystic ovarian  syndrome)    PONV (postoperative nausea and vomiting)    nausea and vomiting   Primary anovulatory infertility    Past Surgical History:  Procedure Laterality Date   CHROMOPERTUBATION N/A 08/08/2019   Procedure: CHROMOPERTUBATION;  Surgeon: Ward, Mitzie BROCKS, MD;  Location: [facility];  Service: Gynecology;  Laterality: N/A;   DILATION AND CURETTAGE OF UTERUS     2015- cyst in tube -Dr leonce [place]   HYSTEROSCOPY WITH D & C N/A 10/05/2017   Procedure: DILATATION AND CURETTAGE /HYSTEROSCOPY;  Surgeon: Kathe Gladis LABOR, MD;  Location: [facility];  Service: Gynecology;  Laterality: N/A;   HYSTEROSCOPY WITH D & C N/A 08/08/2019   Procedure: DILATATION AND CURETTAGE /HYSTEROSCOPY;  Surgeon: Ward, Mitzie BROCKS, MD;  Location: [facility];  Service: Gynecology;  Laterality: N/A;   HYSTEROSCOPY WITH D & C N/A 02/27/2020   Procedure: DILATATION AND CURETTAGE /HYSTEROSCOPY;  Surgeon: Ward, Mitzie BROCKS, MD;  Location: [facility];  Service: Gynecology;  Laterality: N/A;   HYSTEROSCOPY WITH D & C N/A 02/16/2023   Procedure: DILATATION AND CURETTAGE /HYSTEROSCOPY, REMOVIAL OF ABDNORMAL TISSUE;  Surgeon: Leonce Garnette BIRCH, MD;  Location: [facility];  Service: Gynecology;  Laterality: N/A;   LAPAROSCOPIC APPENDECTOMY N/A 10/05/2017   Procedure: APPENDECTOMY LAPAROSCOPIC;  Surgeon: Kathe Gladis LABOR, MD;  Location: [facility];  Service: Gynecology;  Laterality: N/A;   LAPAROSCOPIC LYSIS OF ADHESIONS N/A 10/05/2017   Procedure: LAPAROSCOPIC LYSIS OF ADHESIONS;  Surgeon: Kathe Gladis LABOR, MD;  Location: [facility];  Service: Gynecology;  Laterality: N/A;   LAPAROSCOPIC OVARIAN CYSTECTOMY Right 10/05/2017   Procedure: LAPAROSCOPIC OVARIAN CYSTECTOMY;  Surgeon: Kathe Gladis LABOR, MD;  Location: [facility];  Service: Gynecology;  Laterality: Right;   LAPAROSCOPIC OVARIAN CYSTECTOMY N/A 08/08/2019   Procedure: LAPAROSCOPIC OVARIAN CYSTECTOMY;  Surgeon: Ward, Mitzie BROCKS, MD;  Location: [facility];  Service: Gynecology;   Laterality: N/A;   LAPAROSCOPIC SALPINGO OOPHERECTOMY Right 10/05/2017   Procedure: LAPAROSCOPIC SALPINGO OOPHORECTOMY;  Surgeon: Kathe Gladis LABOR, MD;  Location: [facility];  Service: Gynecology;  Laterality: Right;   OB History  Gravida Para Term Preterm AB Living  0 0 0 0 0 0  SAB IAB Ectopic Multiple Live Births  0 0 0 0   Patient denies any other pertinent gynecologic issues.   No current facility-administered medications on file prior to encounter.   Current Outpatient Medications on File Prior to Encounter  Medication Sig Dispense Refill   escitalopram  (LEXAPRO ) 20 MG tablet Take 1 tablet (20 mg total) by mouth every morning.     Multiple Vitamin (MULTIVITAMIN WITH MINERALS) TABS tablet Take 1 tablet by mouth daily.     aspirin-acetaminophen -caffeine (EXCEDRIN MIGRAINE) 250-250-65 MG tablet Take 2 tablets by mouth every 6 (six) hours as needed for headache.     cetirizine-pseudoephedrine (ZYRTEC-D) 5-120 MG tablet Take 1 tablet by mouth daily as needed for allergies. (Patient not taking: Reported on 05/22/2023)     fluticasone (FLONASE) 50 MCG/ACT nasal spray Place 1 spray into both nostrils daily as needed for allergies or rhinitis. (Patient not taking: Reported on 05/22/2023)     ibuprofen  (ADVIL ) 200 MG  tablet Take 600 mg by mouth every 6 (six) hours as needed.     ibuprofen  (ADVIL ) 600 MG tablet Take 1 tablet (600 mg total) by mouth every 6 (six) hours as needed for moderate pain or mild pain. (Patient not taking: Reported on 05/22/2023) 30 tablet 0   megestrol  (MEGACE ) 40 MG tablet Take 80 mg by mouth 2 (two) times daily. (Patient not taking: Reported on 05/22/2023)     Allergies  Allergen Reactions   Other Anaphylaxis and Hives    Tree Nuts    Social History:   reports that she has never smoked. She has never used smokeless tobacco. She reports that she does not currently use alcohol. She reports that she does not use drugs.  Family History  Problem Relation Age of  Onset   Breast cancer Maternal Grandmother    Diabetes Maternal Grandmother    Lung cancer Paternal Grandmother    Lupus Paternal Grandmother    Diabetes Mother    Thyroid cancer Mother    Colon cancer Other    Cervical cancer Other    Bipolar disorder Brother    Alcohol abuse Brother    Heart disease Neg Hx     Review of Systems: Noncontributory  PHYSICAL EXAM: Pulse 91, temperature (!) 97.4 F (36.3 C), temperature source Tympanic, resp. rate 18, height 5' 4 (1.626 m), weight 113.4 kg, last menstrual period 01/30/2023, SpO2 100%.Body mass index is 42.91 kg/m.  CONSTITUTIONAL: Well-developed, well-nourished female in no acute distress.  HENT:  Normocephalic, atraumatic, External right and left ear normal. Oropharynx is clear and moist EYES: Conjunctivae and EOM are normal. Pupils are equal, round, and reactive to light. No scleral icterus.  NECK: Normal range of motion, supple, no masses SKIN: Skin is warm and dry. No rash noted. Not diaphoretic. No erythema. No pallor. NEUROLGIC: Alert and oriented to person, place, and time. Normal reflexes, muscle tone coordination. No cranial nerve deficit noted. PSYCHIATRIC: Normal mood and affect. Normal behavior. Normal judgment and thought content. CARDIOVASCULAR: Normal heart rate noted, regular rhythm RESPIRATORY: Effort and breath sounds normal, no problems with respiration noted ABDOMEN: Soft, nontender, nondistended. PELVIC: Deferred MUSCULOSKELETAL: Normal range of motion. No edema and no tenderness. 2+ distal pulses.  Labs: Results for orders placed or performed during the hospital encounter of 05/22/23 (from the past 2 weeks)  Pregnancy, urine POC   Collection Time: 05/22/23  6:18 AM  Result Value Ref Range   Preg Test, Ur NEGATIVE NEGATIVE  Results for orders placed or performed during the hospital encounter of 05/18/23 (from the past 2 weeks)  CBC   Collection Time: 05/18/23  8:28 AM  Result Value Ref Range   WBC 7.0 4.0  - 10.5 K/uL   RBC 4.39 3.87 - 5.11 MIL/uL   Hemoglobin 12.4 12.0 - 15.0 g/dL   HCT 63.3 63.9 - 53.9 %   MCV 83.4 80.0 - 100.0 fL   MCH 28.2 26.0 - 34.0 pg   MCHC 33.9 30.0 - 36.0 g/dL   RDW 86.1 88.4 - 84.4 %   Platelets 299 150 - 400 K/uL   nRBC 0.0 0.0 - 0.2 %  Type and screen [facility]   Collection Time: 05/18/23  8:28 AM  Result Value Ref Range   ABO/RH(D) O POS    Antibody Screen NEG    Sample Expiration 06/01/2023,2359    Extend sample reason      NO TRANSFUSIONS OR PREGNANCY IN THE PAST 3 MONTHS Performed at [employer]  [facility], [address], [place], [place] [address]     Imaging Studies: No results found.  Assessment: Patient Active Problem List   Diagnosis Date Noted   Endometrial intraepithelial neoplasia (EIN) 02/16/2023   Menorrhagia with irregular cycle 09/17/2017    Plan: Patient will undergo surgical management with the above-noted surgery.   The risks of surgery were discussed in detail with the patient including but not limited to: bleeding which may require transfusion or reoperation; infection which may require antibiotics; injury to surrounding organs which may involve bowel, bladder, ureters ; need for additional procedures including laparoscopy or laparotomy; thromboembolic phenomenon, surgical site problems and other postoperative/anesthesia complications. Likelihood of success in alleviating the patient's condition was discussed. Routine postoperative instructions will be reviewed with the patient and her family in detail after surgery.  The patient concurred with the proposed plan, giving informed written consent for the surgery.  Patient has been NPO since last night she will remain NPO for procedure.  Anesthesia and OR aware.  Preoperative prophylactic antibiotics, as indicated, and SCDs ordered on call to the OR.  To OR when ready.  Garnette Mace, MD 05/22/2023 7:15 AM

## 2023-05-22 NOTE — Op Note (Signed)
 Operative Note    Name: Pamela Warren  Date of Service: 05/22/2023   DOB: 05-14-1991  MRN: [id number]    Pre-Operative Diagnosis:  1) Menorrhagia with irregular cycle 2) Endometrial Intraepithelial Neoplasia  Post-Operative Diagnosis:  1) Menorrhagia with irregular cycle 2) Endometrial Intraepithelial Neoplasia  Procedures:  1) Robot assisted Total Laparoscopic Hysterectomy, left salpingectomy  2) Lysis of adhesions  3) Cystoscopy  Primary Surgeon: Garnette Mace, MD  Assistant Surgeon: Beverli Dinsmore, MD   EBL: 50 mL   IVF: 600 mL   Urine output: 250 mL  Specimens: Uterus, cervix, and left fallopian tubes  Drains: none  Complications: None   Disposition: PACU   Condition: Stable   Findings:  1) Slight enlarged globular uterus 2) Left ovary with simple cyst lesion, left fallopian tube appeared normal.  The left tube and ovary were adherent to the sigmoid bowel and epiploica. 3) adhesions of bowel to right pelvic sidewall and right round ligament. 4) on cystoscopy, bladder without apparent damage and there was efflux from both ureteral orifices.  Procedure Summary:  The patient was taken to the operating room where general anesthesia was administered and found to be adequate. She was placed in the dorsal supine lithotomy position in [place] stirrups and prepped and draped in the usual, sterile fashion. After a timeout was called an indwelling catheter was placed in her bladder.  A sterile speculum was placed in her vagina.  The anterior lip of the cervix was grasped with the single-tooth tenaculum.  The cervix was serially dilated to an 11 Pratt dilator.  The medium Vcare device was placed in accordance to the manufacturer's recommendations.  The tenaculum and speculum were removed.   Attention was turned to the abdomen where after injection of local anesthetic, an 8 mm Palmer's point incision was made with the scalpel.  Prior to entry, verification of an NG  tube by anesthesia with was performed.  Entry into the abdomen was obtained via Optiview trocar technique (a blunt entry technique with camera visualization through the obturator upon entry). Verification of entry into the abdomen was obtained using opening pressures. The abdomen was insufflated with CO2. The camera was introduced through the trocar with verification of atraumatic entry.  An infraumbilical and right lateral abdominal entry sites were created after injection of local anesthetic.  The right lateral port was about 8 cm lateral to the umbilical port in accordance with the Intuitive manufacturer's recommendations.  The port sites were 8 mm.  The intuitive trochars were introduced under intra-abdominal camera visualization without difficulty   The XI robot was docked on the patient's left.  Clearance was verified from the patient's legs.  Through the umbilical port the camera was placed.  Through the port attached to arm 4 the monopolar scissors were placed.  Through the port attached to arm 2 the forced bipolar forceps were was placed.     An inspection was undertaken of the pelvis with the above-noted findings.  Adhesions on the right pelvic sidewall were taken down.  This was accomplished without difficulty.  Most of the adhesions were taken down on the left side.  However there was oozing near the ovary from the epiploica.  It was believed that the surgery could be accomplished without further lysis of adhesions.  The bilateral ureters were identified and found to be well away from the operative area of interest.  The bladder flap was created without difficulty.  The right round ligament was cauterized and transected.  The anterior  broad ligament was connected between the right round ligament and the bladder flap.  This was opened gently.  The same procedure was carried out on the left side.  The left utero-ovarian ligament was cauterized and transected carefully.  The left uterine artery was  skeletonized and then cauterized and transected.  This was further divided away from the vagina to a point lateral to the KOH ring.  The right posterior broad ligament was opened and the right uterine artery was skeletonized, cauterized, and transected.  Similarly, the tissue was divided away from the vaginal tissue to gain access to the [facility] ring through the vaginal tissue.  The colpotomy was performed using monopolar electrocautery in a circumferential fashion following the KOH ring.  The uterus. Left fallopian tube, and cervix were removed through the vagina.   Closure of the vaginal cuff was undertaken using the V-lock stitch in a running fashion.  All vascular pedicles were inspected and found to be hemostatic after lowering the intra-abdominal pressure to 5 mmHg.  Arista 3 g was placed along all vascular pedicles to ensure ongoing hemostasis.  All instruments removed from the robotic ports.  The robot was undocked from the patient.  The abdomen was then desufflated of CO2 with the aid of 5 deep breaths from anesthesia.  All trochars were then removed.  All skin incisions were closed using 4-0 Vicryl in a subcuticular fashion and reinforced using surgical skin glue.   Cystoscopy was undertaken at this point. The Foley catheter was removed and the 30 cystoscope was gently introduced through the urethra. The bladder survey was undertaken with efflux of urine from both orifices noted. There were no defects noted in the bladder wall. The cystoscope was utilized to fully empty the bladder.  A sterile speculum was placed in the vagina.  The vaginal cuff closure was found to be hemostatic.  Visual inspection was undertaken to ensure no instruments or sponges remained and this was verified to be clear.  After removing the speculum a digital exam was also performed to verify no instruments or sponges remained in the vagina at this point.  For this case an experienced assistant was necessary.  This was a  difficult case and uterine manipulation was performed by the assisting MD.  Additionally, cystoscopy was performed by the assistant MD.  Also, removal of the specimen was performed by the assisting MD.  The patient tolerated the procedure well.  Sponge, lap, needle, and instrument counts were correct x 2.  VTE prophylaxis: SCDs. Antibiotic prophylaxis: Ancef  3 grams IV prior to skin incision.  The assistant in this case was an MD due to the lack of availability of another sales promotion account executive.  The patient was awakened in the operating room and was taken to the PACU in stable condition.   Garnette Mace, MD, [facility] 05/22/2023 10:45 AM

## 2023-05-22 NOTE — Anesthesia Procedure Notes (Signed)
 Procedure Name: Intubation Date/Time: 05/22/2023 7:41 AM  Performed by: Belinda, Journee Bobrowski, CRNAPre-anesthesia Checklist: Patient identified, Emergency Drugs available, Suction available and Patient being monitored Patient Re-evaluated:Patient Re-evaluated prior to induction Oxygen Delivery Method: Circle system utilized Preoxygenation: Pre-oxygenation with 100% oxygen Induction Type: IV induction Ventilation: Mask ventilation without difficulty Laryngoscope Size: McGrath and 3 Grade View: Grade I Tube type: Oral Tube size: 6.5 mm Number of attempts: 1 Airway Equipment and Method: Stylet Placement Confirmation: ETT inserted through vocal cords under direct vision, positive ETCO2 and breath sounds checked- equal and bilateral Secured at: 20 cm Tube secured with: Tape Dental Injury: Teeth and Oropharynx as per pre-operative assessment

## 2023-05-22 NOTE — Anesthesia Preprocedure Evaluation (Signed)
 Anesthesia Evaluation  Patient identified by MRN, date of birth, ID band Patient awake    Reviewed: Allergy & Precautions, NPO status , Patient's Chart, lab work & pertinent test results  History of Anesthesia Complications (+) PONV and history of anesthetic complications  Airway Mallampati: III  TM Distance: <3 FB Neck ROM: full    Dental  (+) Chipped, Dental Advidsory Given   Pulmonary neg pulmonary ROS, neg shortness of breath   Pulmonary exam normal        Cardiovascular Exercise Tolerance: Good (-) angina (-) Past MI negative cardio ROS Normal cardiovascular exam     Neuro/Psych  Headaches, neg Seizures PSYCHIATRIC DISORDERS Anxiety        GI/Hepatic negative GI ROS, Neg liver ROS,neg GERD  ,,  Endo/Other  neg diabetes  Class 3 obesity  Renal/GU      Musculoskeletal   Abdominal   Peds  Hematology negative hematology ROS (+)   Anesthesia Other Findings Past Medical History: No date: Anxiety No date: Complex endometrial hyperplasia     Comment:  right No date: Endometrial intraepithelial neoplasia (EIN) No date: Headache     Comment:  stress/job related No date: Heavy menstrual bleeding No date: Infertility, female No date: Morbid obesity (HCC) No date: Ovarian cyst No date: PCOS (polycystic ovarian syndrome) No date: PONV (postoperative nausea and vomiting)     Comment:  nausea and vomiting  Past Surgical History: 08/08/2019: CHROMOPERTUBATION; N/A     Comment:  Procedure: CHROMOPERTUBATION;  Surgeon: Ward, Mitzie BROCKS,              MD;  Location: [facility];  Service: Gynecology;                Laterality: N/A; No date: DILATION AND CURETTAGE OF UTERUS     Comment:  2015- cyst in tube -Dr leonce [place] 10/05/2017: HYSTEROSCOPY WITH D & C; N/A     Comment:  Procedure: DILATATION AND CURETTAGE /HYSTEROSCOPY;                Surgeon: Kathe Gladis LABOR, MD;  Location: [facility];               Service:  Gynecology;  Laterality: N/A; 08/08/2019: HYSTEROSCOPY WITH D & C; N/A     Comment:  Procedure: DILATATION AND CURETTAGE /HYSTEROSCOPY;                Surgeon: Ward, Mitzie BROCKS, MD;  Location: [facility];                Service: Gynecology;  Laterality: N/A; 02/27/2020: HYSTEROSCOPY WITH D & C; N/A     Comment:  Procedure: DILATATION AND CURETTAGE /HYSTEROSCOPY;                Surgeon: Ward, Mitzie BROCKS, MD;  Location: [facility];                Service: Gynecology;  Laterality: N/A; 10/05/2017: LAPAROSCOPIC APPENDECTOMY; N/A     Comment:  Procedure: APPENDECTOMY LAPAROSCOPIC;  Surgeon:               Kathe Gladis LABOR, MD;  Location: [facility];  Service:              Gynecology;  Laterality: N/A; 10/05/2017: LAPAROSCOPIC LYSIS OF ADHESIONS; N/A     Comment:  Procedure: LAPAROSCOPIC LYSIS OF ADHESIONS;  Surgeon:               Kathe Gladis LABOR, MD;  Location:  [facility];  Service:              Gynecology;  Laterality: N/A; 10/05/2017: LAPAROSCOPIC OVARIAN CYSTECTOMY; Right     Comment:  Procedure: LAPAROSCOPIC OVARIAN CYSTECTOMY;  Surgeon:               Kathe Gladis LABOR, MD;  Location: [facility];  Service:              Gynecology;  Laterality: Right; 08/08/2019: LAPAROSCOPIC OVARIAN CYSTECTOMY; N/A     Comment:  Procedure: LAPAROSCOPIC OVARIAN CYSTECTOMY;  Surgeon:               Ward, Mitzie BROCKS, MD;  Location: [facility];  Service:               Gynecology;  Laterality: N/A; 10/05/2017: LAPAROSCOPIC SALPINGO OOPHERECTOMY; Right     Comment:  Procedure: LAPAROSCOPIC SALPINGO OOPHORECTOMY;  Surgeon:              Kathe Gladis LABOR, MD;  Location: [facility];  Service:              Gynecology;  Laterality: Right;  BMI    Body Mass Index: 47.30 kg/m      Reproductive/Obstetrics negative OB ROS                             Anesthesia Physical Anesthesia Plan  ASA: 3  Anesthesia Plan: General   Post-op Pain Management:    Induction: Intravenous  PONV  Risk Score and Plan: Ondansetron , Dexamethasone , Midazolam , Treatment may vary due to age or medical condition, Propofol  infusion and TIVA  Airway Management Planned: Oral ETT  Additional Equipment:   Intra-op Plan:   Post-operative Plan: Extubation in OR  Informed Consent: I have reviewed the patients History and Physical, chart, labs and discussed the procedure including the risks, benefits and alternatives for the proposed anesthesia with the patient or authorized representative who has indicated his/her understanding and acceptance.     Dental Advisory Given  Plan Discussed with: Anesthesiologist, CRNA and Surgeon  Anesthesia Plan Comments: (Patient consented for risks of anesthesia including but not limited to:  - adverse reactions to medications - damage to eyes, teeth, lips or other oral mucosa - nerve damage due to positioning  - sore throat or hoarseness - Damage to heart, brain, nerves, lungs, other parts of body or loss of life  Patient voiced understanding.)       Anesthesia Quick Evaluation

## 2023-05-22 NOTE — Anesthesia Postprocedure Evaluation (Signed)
 Anesthesia Post Note  Patient: KHALEELAH YOWELL  Procedure(s) Performed: XI ROBOTIC ASSISTED LAPAROSCOPIC HYSTERECTOMY AND SALPINGECTOMY (Bilateral) CYSTOSCOPY XI ROBOTIC ASSISTED LAPAROSCOPIC LYSIS OF ADHESION  Patient location during evaluation: PACU Anesthesia Type: General Level of consciousness: awake and alert Pain management: pain level controlled Vital Signs Assessment: post-procedure vital signs reviewed and stable Respiratory status: spontaneous breathing, nonlabored ventilation, respiratory function stable and patient connected to nasal cannula oxygen Cardiovascular status: blood pressure returned to baseline and stable Postop Assessment: no apparent nausea or vomiting Anesthetic complications: no   No notable events documented.   Last Vitals:  Vitals:   05/22/23 1130 05/22/23 1145  BP: (!) 137/94 128/84  Pulse: 88 80  Resp: 12 16  Temp:    SpO2: 96% 94%    Last Pain:  Vitals:   05/22/23 1130  TempSrc:   PainSc: 3                  Lynwood KANDICE Clause

## 2023-05-22 NOTE — Transfer of Care (Signed)
 Immediate Anesthesia Transfer of Care Note  Patient: Pamela Warren  Procedure(s) Performed: XI ROBOTIC ASSISTED LAPAROSCOPIC HYSTERECTOMY AND SALPINGECTOMY (Bilateral) CYSTOSCOPY XI ROBOTIC ASSISTED LAPAROSCOPIC LYSIS OF ADHESION  Patient Location: PACU  Anesthesia Type:General  Level of Consciousness: drowsy  Airway & Oxygen Therapy: Patient Spontanous Breathing and Patient connected to face mask oxygen  Post-op Assessment: Report given to RN and Post -op Vital signs reviewed and stable  Post vital signs: Reviewed and stable  Last Vitals:  Vitals Value Taken Time  BP 126/75   Temp    Pulse 87   Resp 20   SpO2 100     Last Pain:  Vitals:   05/22/23 0623  TempSrc: Tympanic         Complications: No notable events documented.

## 2023-05-31 LAB — SURGICAL PATHOLOGY

## 2024-05-05 ENCOUNTER — Telehealth: Payer: Self-pay | Admitting: Internal Medicine

## 2024-05-05 ENCOUNTER — Encounter: Payer: Self-pay | Admitting: Internal Medicine

## 2024-05-05 DIAGNOSIS — J101 Influenza due to other identified influenza virus with other respiratory manifestations: Secondary | ICD-10-CM

## 2024-05-05 MED ORDER — PREDNISONE 10 MG PO TABS: ORAL_TABLET | ORAL | 0 refills | Status: AC

## 2024-05-05 MED ORDER — PROMETHAZINE-DM 6.25-15 MG/5ML PO SYRP: 5.0000 mL | ORAL_SOLUTION | Freq: Four times a day (QID) | ORAL | 0 refills | Status: AC | PRN

## 2024-05-05 MED ORDER — ALBUTEROL SULFATE HFA 108 (90 BASE) MCG/ACT IN AERS: 2.0000 | INHALATION_SPRAY | Freq: Four times a day (QID) | RESPIRATORY_TRACT | 0 refills | Status: AC | PRN

## 2024-05-05 NOTE — Patient Instructions (Signed)

## 2024-05-05 NOTE — Progress Notes (Signed)
 Virtual Visit via Video Note  I connected with Pamela Warren on 05/05/2024 at  3:20 PM EST by a video enabled telemedicine application and verified that I am speaking with the correct person using two identifiers.  Location: Patient: Home Provider: Office  Person's participating in this video call: Pamela Laura, NP-C and Pamela Warren   I discussed the limitations of evaluation and management by telemedicine and the availability of in person appointments. The patient expressed understanding and agreed to proceed.  History of Present Illness:   Discussed the use of AI scribe software for clinical note transcription with the patient, who gave verbal consent to proceed.  Pamela Warren is a 33 year old female who presents with flu-like symptoms and a positive flu A test.  She began experiencing flu-like symptoms last Thursday, including severe sinus pressure, headache, and a deep cough. Initially, she thought it was sinus-related and started taking Advil  sinus and cold medication along with nasal spray, which provided some relief.  By Saturday, her condition worsened significantly, describing it as feeling 'like I had been hit by a truck,' and she slept for approximately 20 out of 24 hours. She has had a persistent headache for several days, along with runny nose, nasal congestion, and a deep cough producing green sputum.  She reports right-sided facial discomfort, including her jaw and ear, but no ear pain, describing it instead as a feeling of fullness, 'like I feel like I'm in a tunnel.' She experiences shortness of breath when active but not at rest.  She has had diarrhea but no nausea or vomiting. She experienced chills and body aches but no fever, despite feeling extremely cold on Saturday, requiring multiple blankets and hot baths to try to warm up.  She took a home test for flu, which was positive for flu A. She has not had a flu shot this year, which is unusual for her, as  she typically receives it at her previous workplace's flu clinic.  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 She has a three-year-old child who does not understand why she cannot be around her mother during this illness.      Past Medical History:  Diagnosis Date   Anxiety    Complex endometrial hyperplasia    right   Endometrial intraepithelial neoplasia (EIN)    Headache    stress/job related   Heavy menstrual bleeding    Infertility, female    Menorrhagia with irregular cycle    Morbid obesity (HCC)    Ovarian cyst    PCOS (polycystic ovarian syndrome)    PONV (postoperative nausea and vomiting)    nausea and vomiting   Primary anovulatory infertility     Current Outpatient Medications  Medication Sig Dispense Refill   escitalopram  (LEXAPRO ) 20 MG tablet Take 1 tablet (20 mg total) by mouth every morning.     ibuprofen  (ADVIL ) 600 MG tablet Take 1 tablet (600 mg total) by mouth every 6 (six) hours. 30 tablet 0   Multiple Vitamin (MULTIVITAMIN WITH MINERALS) TABS tablet Take 1 tablet by mouth daily.     ondansetron  (ZOFRAN -ODT) 4 MG disintegrating tablet Take 1 tablet (4 mg total) by mouth every 6 (six) hours as needed for nausea. 20 tablet 0   oxyCODONE -acetaminophen  (PERCOCET) 5-325 MG tablet Take 1 tablet by mouth every 6 (six) hours as needed (breakthrough pain). 20 tablet 0   No current facility-administered medications for this visit.  Allergies[1]  Family History  Problem Relation Age of Onset   Breast cancer Maternal Grandmother    Diabetes Maternal Grandmother    Lung cancer Paternal Grandmother    Lupus Paternal Grandmother    Diabetes Mother    Thyroid cancer Mother    Colon cancer Other    Cervical cancer Other    Bipolar disorder Brother    Alcohol abuse Brother    Heart disease Neg Hx     Social History   Socioeconomic History   Marital status: Married    Spouse name: [name]   Number of children: Not on  file   Years of education: Not on file   Highest education level: Not on file  Occupational History   Not on file  Tobacco Use   Smoking status: Never   Smokeless tobacco: Never  Vaping Use   Vaping status: Never Used  Substance and Sexual Activity   Alcohol use: Not Currently   Drug use: No   Sexual activity: Yes  Other Topics Concern   Not on file  Social History Narrative   Not on file   Social Drivers of Health   Tobacco Use: Unknown (02/16/2024)   Received from [facility]   Patient History    Smoking Tobacco Use: Never    Smokeless Tobacco Use: Unknown    Passive Exposure: Not on file  Financial Resource Strain: Not on file  Food Insecurity: Not on file  Transportation Needs: Not on file  Physical Activity: Not on file  Stress: Not on file  Social Connections: Not on file  Intimate Partner Violence: Not on file  Depression (EYV7-0): Not on file  Alcohol Screen: Not on file  Housing: Not on file  Utilities: Not on file  Health Literacy: Not on file     Constitutional: Pt reports headache, chills. Denies fever, malaise, fatigue, or abrupt weight changes.  HEENT: Pt reports loss of taste, sinus pressure, runny nose, nasal congestion, and ear fullness .Denies eye pain, eye redness, ear pain, ringing in the ears, wax buildup, bloody nose, or sore throat. Respiratory: Pt reports cough, shortness of breath. Denies difficulty breathing, or sputum production.   Cardiovascular: Denies chest pain, chest tightness, palpitations or swelling in the hands or feet.  Gastrointestinal: Pt reports diarrhea. Denies abdominal pain, bloating, constipation, or blood in the stool.  GU: Denies urgency, frequency, pain with urination, burning sensation, blood in urine, odor or discharge. Musculoskeletal: Pt reports body aches. Denies decrease in range of motion, difficulty with gait, or joint swelling.  Skin: Denies redness, rashes, lesions or ulcercations.  Neurological: Denies  dizziness, difficulty with memory, difficulty with speech or problems with balance and coordination.    No other specific complaints in a complete review of systems (except as listed in HPI above).  Observations/Objective:   Wt Readings from Last 3 Encounters:  05/22/23 250 lb (113.4 kg)  05/14/23 240 lb (108.9 kg)  02/16/23 275 lb 9.2 oz (125 kg)    General: Appears her stated age, appears unwell but, in NAD. Pulmonary/Chest: Normal effort. No respiratory distress.  Neurological: Alert and oriented.   BMET    Component Value Date/Time   NA 137 02/16/2023 0635   NA 138 08/20/2013 1043   K 3.9 02/16/2023 0635   K 4.0 08/20/2013 1043   CL 107 02/16/2023 0635   CL 106 08/20/2013 1043   CO2 21 (L) 02/16/2023 0635   CO2 28 08/20/2013 1043   GLUCOSE 98 02/16/2023 9364  GLUCOSE 84 08/20/2013 1043   BUN 11 02/16/2023 0635   BUN 13 08/20/2013 1043   CREATININE 0.60 02/16/2023 0635   CREATININE 0.66 08/20/2013 1043   CALCIUM 9.2 02/16/2023 0635   CALCIUM 9.0 08/20/2013 1043   GFRNONAA >60 02/16/2023 0635   GFRNONAA >60 08/20/2013 1043   GFRAA >60 08/07/2019 0822   GFRAA >60 08/20/2013 1043    Lipid Panel  No results found for: CHOL, TRIG, HDL, CHOLHDL, VLDL, LDLCALC  CBC    Component Value Date/Time   WBC 7.0 05/18/2023 0828   RBC 4.39 05/18/2023 0828   HGB 12.4 05/18/2023 0828   HGB 10.3 (L) 08/20/2013 1043   HCT 36.6 05/18/2023 0828   HCT 31.5 (L) 08/20/2013 1043   PLT 299 05/18/2023 0828   PLT 358 08/20/2013 1043   MCV 83.4 05/18/2023 0828   MCV 78 (L) 08/20/2013 1043   MCH 28.2 05/18/2023 0828   MCHC 33.9 05/18/2023 0828   RDW 13.8 05/18/2023 0828   RDW 15.5 (H) 08/20/2013 1043   LYMPHSABS 1.8 10/05/2017 1140   LYMPHSABS 1.7 01/11/2012 2052   MONOABS 0.7 10/05/2017 1140   MONOABS 1.2 (H) 01/11/2012 2052   EOSABS 0.1 10/05/2017 1140   EOSABS 0.1 01/11/2012 2052   BASOSABS 0.0 10/05/2017 1140   BASOSABS 0.0 01/11/2012 2052    Hgb A1C No  results found for: HGBA1C     Assessment and Plan:  Assessment and Plan    Influenza A with acute upper and lower respiratory symptoms Influenza A confirmed. Symptoms consistent with viral influenza. Outside antiviral treatment window. Antibiotics not indicated. - Prescribed prednisone  10 mg taper x 6 days for sinus inflammation. - Prescribed promethazine  DM cough syrup every 8 hours as needed-sedation caution given. - Advised rest and increased fluid intake. - Recommended Tylenol  for fever management. - Prescribed albuterol  inhaler. - Provided work note for absence through Thursday.       Follow Up Instructions:    I discussed the assessment and treatment plan with the patient. The patient was provided an opportunity to ask questions and all were answered. The patient agreed with the plan and demonstrated an understanding of the instructions.   The patient was advised to call back or seek an in-person evaluation if the symptoms worsen or if the condition fails to improve as anticipated.   Pamela Laura, NP     [1]  Allergies Allergen Reactions   Other Anaphylaxis and Hives    Tree Nuts

## 2024-05-07 ENCOUNTER — Other Ambulatory Visit: Payer: Self-pay | Admitting: Internal Medicine

## 2024-05-07 ENCOUNTER — Ambulatory Visit: Payer: Self-pay | Admitting: Internal Medicine

## 2024-05-07 ENCOUNTER — Ambulatory Visit
Admission: RE | Admit: 2024-05-07 | Discharge: 2024-05-07 | Disposition: A | Discharge status: Home / Self Care | Payer: Self-pay | Source: Ambulatory Visit | Attending: Internal Medicine | Admitting: Internal Medicine

## 2024-05-07 ENCOUNTER — Ambulatory Visit: Payer: Self-pay

## 2024-05-07 DIAGNOSIS — R051 Acute cough: Secondary | ICD-10-CM

## 2024-05-07 DIAGNOSIS — J101 Influenza due to other identified influenza virus with other respiratory manifestations: Secondary | ICD-10-CM

## 2024-05-07 DIAGNOSIS — R0602 Shortness of breath: Secondary | ICD-10-CM | POA: Insufficient documentation

## 2024-05-07 NOTE — Telephone Encounter (Signed)
 Chest x ray ordered.

## 2024-05-07 NOTE — Telephone Encounter (Signed)
 Routing this call to [name], FNP who saw her for virtual visit on 12/15 to advise on further management  Marsa Officer, DO [facility] 05/07/2024, 9:55 AM

## 2024-05-07 NOTE — Telephone Encounter (Signed)
 FYI Only or Action Required?: FYI only for provider: ED advised.  Patient was last seen in primary care on 05/05/2024 by Antonette Angeline ORN, NP.  Called Nurse Triage reporting Cough and Shortness of Breath.  Symptoms began a week ago.  Interventions attempted: Prescription medications: prednisone , albuterol , promethazine  cough medicine.  Symptoms are: gradually worsening.  Triage Disposition: See HCP Within 4 Hours (Or PCP Triage)  Patient/caregiver understands and will follow disposition?: No, wishes to speak with PCP    Copied from CRM #[id number]. Topic: Clinical - Red Word Triage >> May 07, 2024  8:37 AM Joesph NOVAK wrote: Red Word that prompted transfer to Nurse Triage: Symptoms worsening, SOB, wheezing. Hard time breathing last night. Coughing. Office told her to call and ask regina for a xray. Reason for Disposition  [1] MILD difficulty breathing (e.g., minimal/no SOB at rest, SOB with walking, pulse < 100) AND [2] NEW-onset or WORSE than normal  Answer Assessment - Initial Assessment Questions Pt tested + flu A on Monday, had telehealth visit with regina. Gave her prednisone , cough medicine and inhaler. Pt states symptoms worsened last night, increased cough, shortness of breath, raspy. She said 'I'm sure this is pneumonia. She reached out to someone at the office who typically schedules her appts for her but advised her to call in. Pt is requesting a chest xray. Pt states that cough is deep, worse at night with laying down. This morning coughing constantly, states she's not that bad but it is harder to breath. +wheezing. Pt states she has been using inhaler 6 times a day. Rn did advise ER since her shortness of breath increased so much. Pt states that she is not doing that. She is requesting an xray to confirm she has pneumonia. RN advised message would be sent.      1. RESPIRATORY STATUS: Describe your breathing? (e.g., wheezing, shortness of breath, unable to speak, severe  coughing)      Wheezing, shortness of breath, severe coughing.  2. ONSET: When did this breathing problem begin?      Over the weekend 3. PATTERN Does the difficult breathing come and go, or has it been constant since it started?      Worse with laying down.  4. SEVERITY: How bad is your breathing? (e.g., mild, moderate, severe)      Mild to mod 5. RECURRENT SYMPTOM: Have you had difficulty breathing before? If Yes, ask: When was the last time? and What happened that time?      no 6. CARDIAC HISTORY: Do you have any history of heart disease? (e.g., heart attack, angina, bypass surgery, angioplasty)      no 7. LUNG HISTORY: Do you have any history of lung disease?  (e.g., pulmonary embolus, asthma, emphysema)     no 8. CAUSE: What do you think is causing the breathing problem?      Flu A 9. OTHER SYMPTOMS: Do you have any other symptoms? (e.g., chest pain, cough, dizziness, fever, runny nose)     Cough, denies fever  Protocols used: Breathing Difficulty-A-AH
